# Patient Record
Sex: Male | Born: 1985 | Race: White | Hispanic: No | Marital: Single
Health system: Midwestern US, Community
[De-identification: ages and names within clinical notes are randomized; demographics above are authoritative.]

## PROBLEM LIST (undated history)

## (undated) ENCOUNTER — Emergency Department (HOSPITAL_COMMUNITY): Admission: EM | Payer: Self-pay | Source: Home / Self Care

## (undated) DIAGNOSIS — E079 Disorder of thyroid, unspecified: Secondary | ICD-10-CM

## (undated) HISTORY — PX: TONSILLECTOMY: SUR1361

## (undated) HISTORY — PX: GANGLION CYST EXCISION: SHX1691

## (undated) HISTORY — PX: EYE MUSCLE SURGERY: SHX370

## (undated) HISTORY — PX: TYMPANOSTOMY TUBE PLACEMENT: SHX32

## (undated) HISTORY — DX: Disorder of thyroid, unspecified: E07.9

---
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
## 2006-05-19 ENCOUNTER — Emergency Department (HOSPITAL_COMMUNITY): Admission: EM | Admit: 2006-05-19 | Discharge: 2006-05-19 | Payer: Self-pay | Admitting: Emergency Medicine

## 2006-12-18 ENCOUNTER — Encounter (HOSPITAL_COMMUNITY): Admission: RE | Admit: 2006-12-18 | Discharge: 2006-12-19 | Payer: Self-pay | Admitting: Family Medicine

## 2007-01-10 ENCOUNTER — Ambulatory Visit (HOSPITAL_COMMUNITY): Admission: RE | Admit: 2007-01-10 | Discharge: 2007-01-10 | Payer: Self-pay | Admitting: Endocrinology

## 2007-01-10 DIAGNOSIS — E079 Disorder of thyroid, unspecified: Secondary | ICD-10-CM

## 2007-01-10 HISTORY — DX: Disorder of thyroid, unspecified: E07.9

## 2008-01-13 ENCOUNTER — Emergency Department (HOSPITAL_COMMUNITY): Admission: EM | Admit: 2008-01-13 | Discharge: 2008-01-13 | Payer: Self-pay | Admitting: Emergency Medicine

## 2008-04-06 IMAGING — CR DG CHEST 1V PORT
1 series · 1 of 1 positions shown · non-contrast
Comparison: none

HISTORY: Chest pain, dyspnea

PORTABLE CHEST ONE VIEW:
Portable exam 5445 hours without priors for comparison.
Borderline cardiac enlargement
Pulmonary vascular congestion
Minimal bronchitic changes without infiltrate or effusion.
Multiple cardiac monitoring lines project over chest.
No pneumothorax.

[view not recorded]
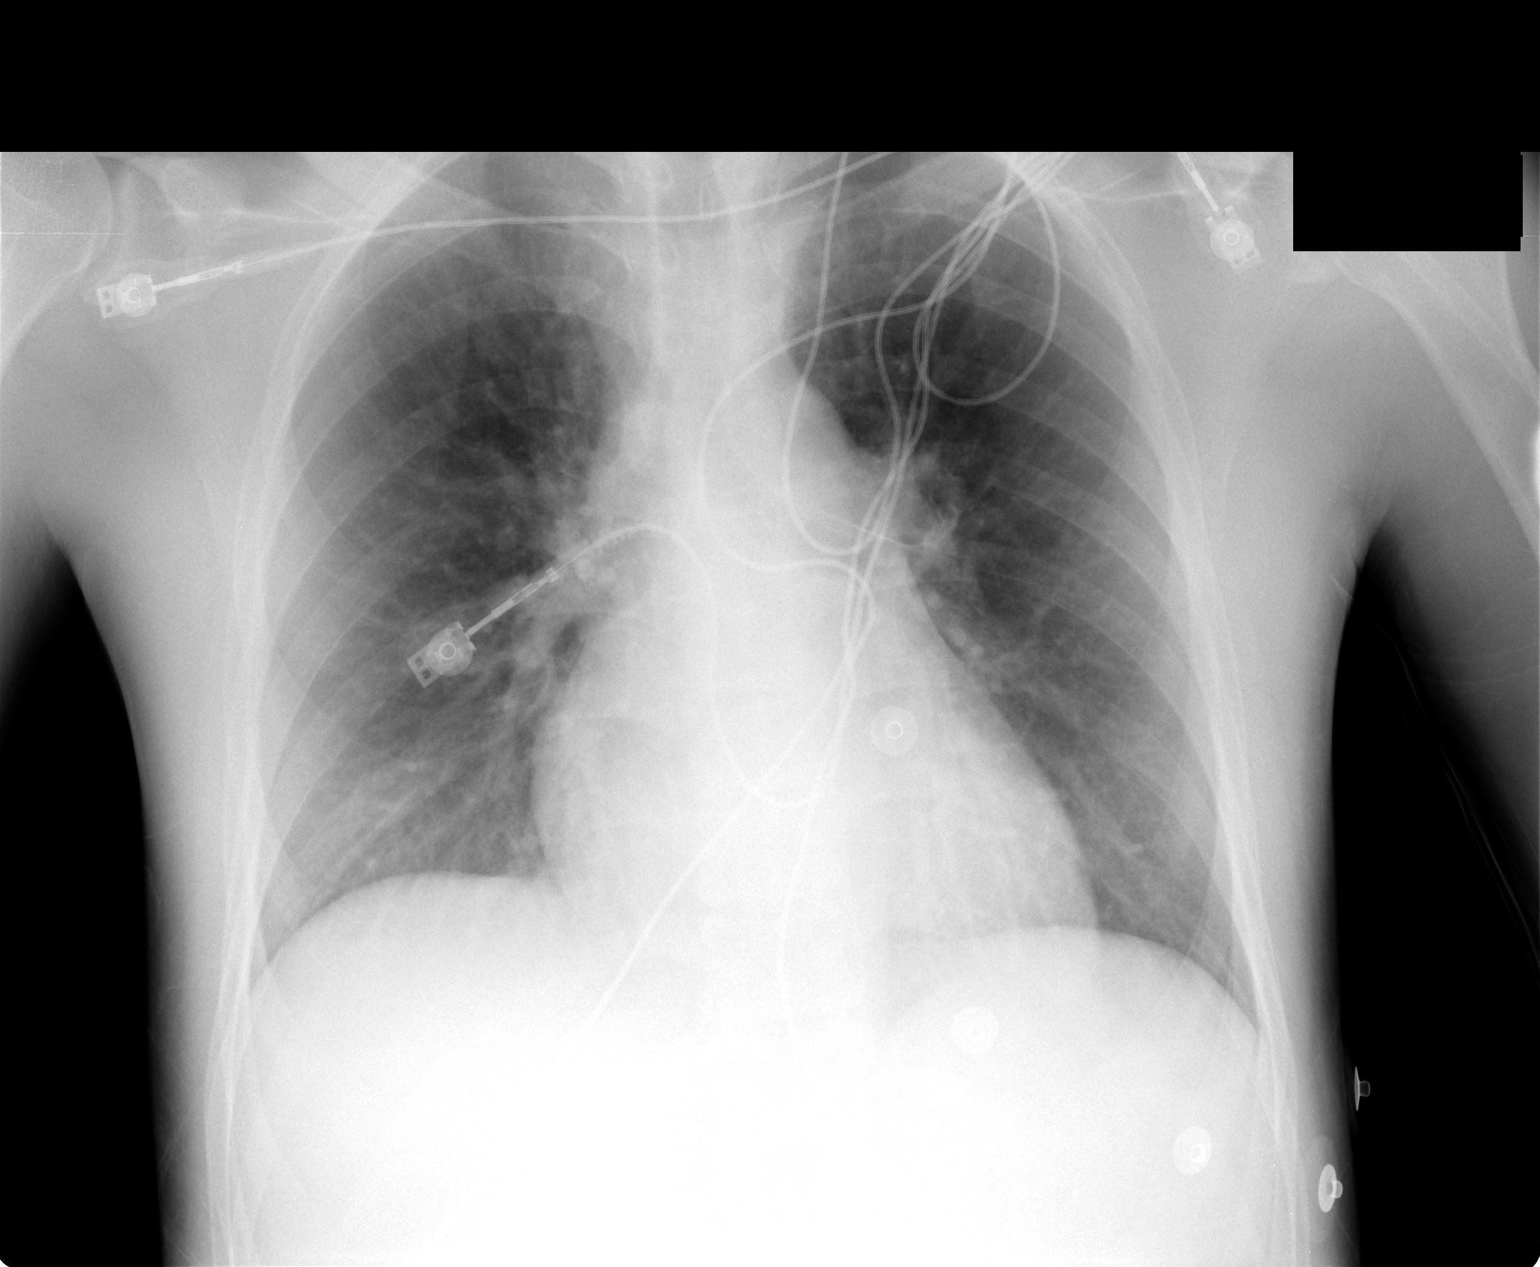

[1 of 1 positions shown; findings below may reference images not displayed]

IMPRESSION: Mild bronchitic changes.

## 2009-12-01 IMAGING — CR DG CHEST 2V
3 series · 3 of 3 positions shown · non-contrast
Comparison: 05/19/2006.

CLINICAL DATA: Neck and back pain.  Short of breath and cough.

CHEST - 2 VIEW

[view not recorded (1 of 3)]
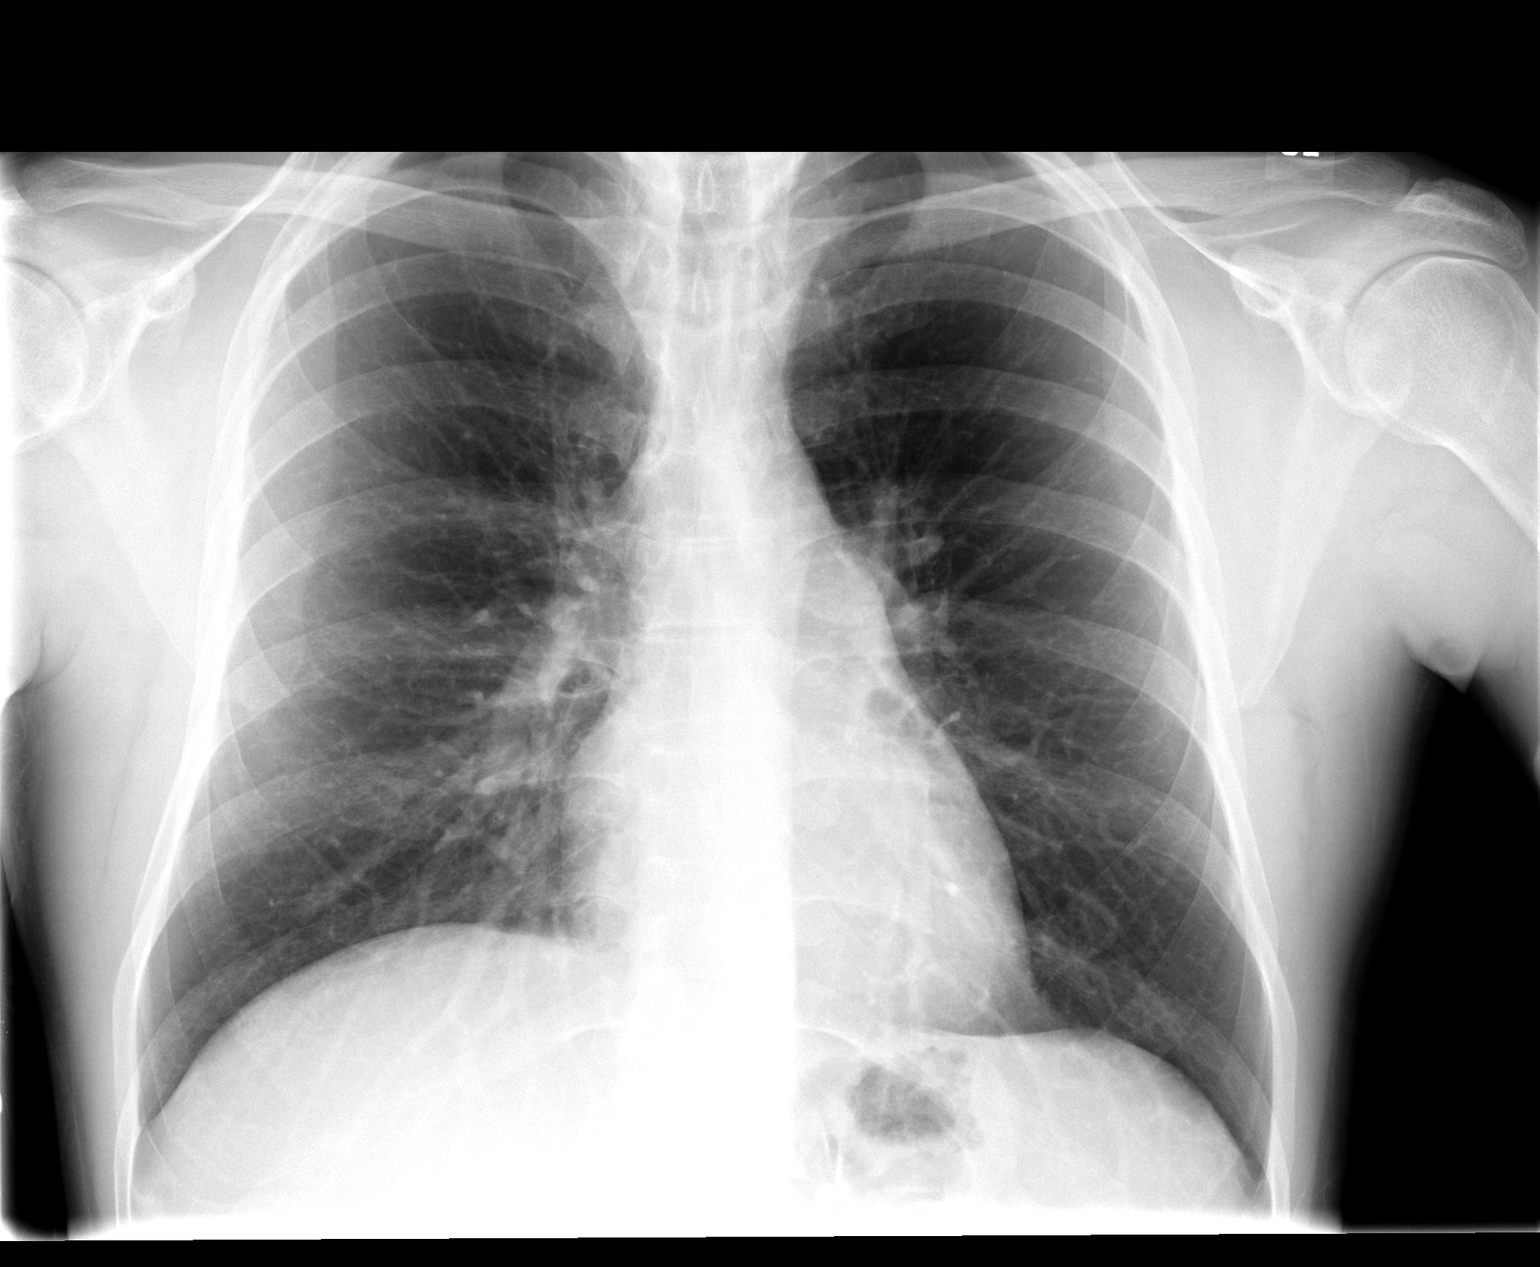

[view not recorded (2 of 3)]
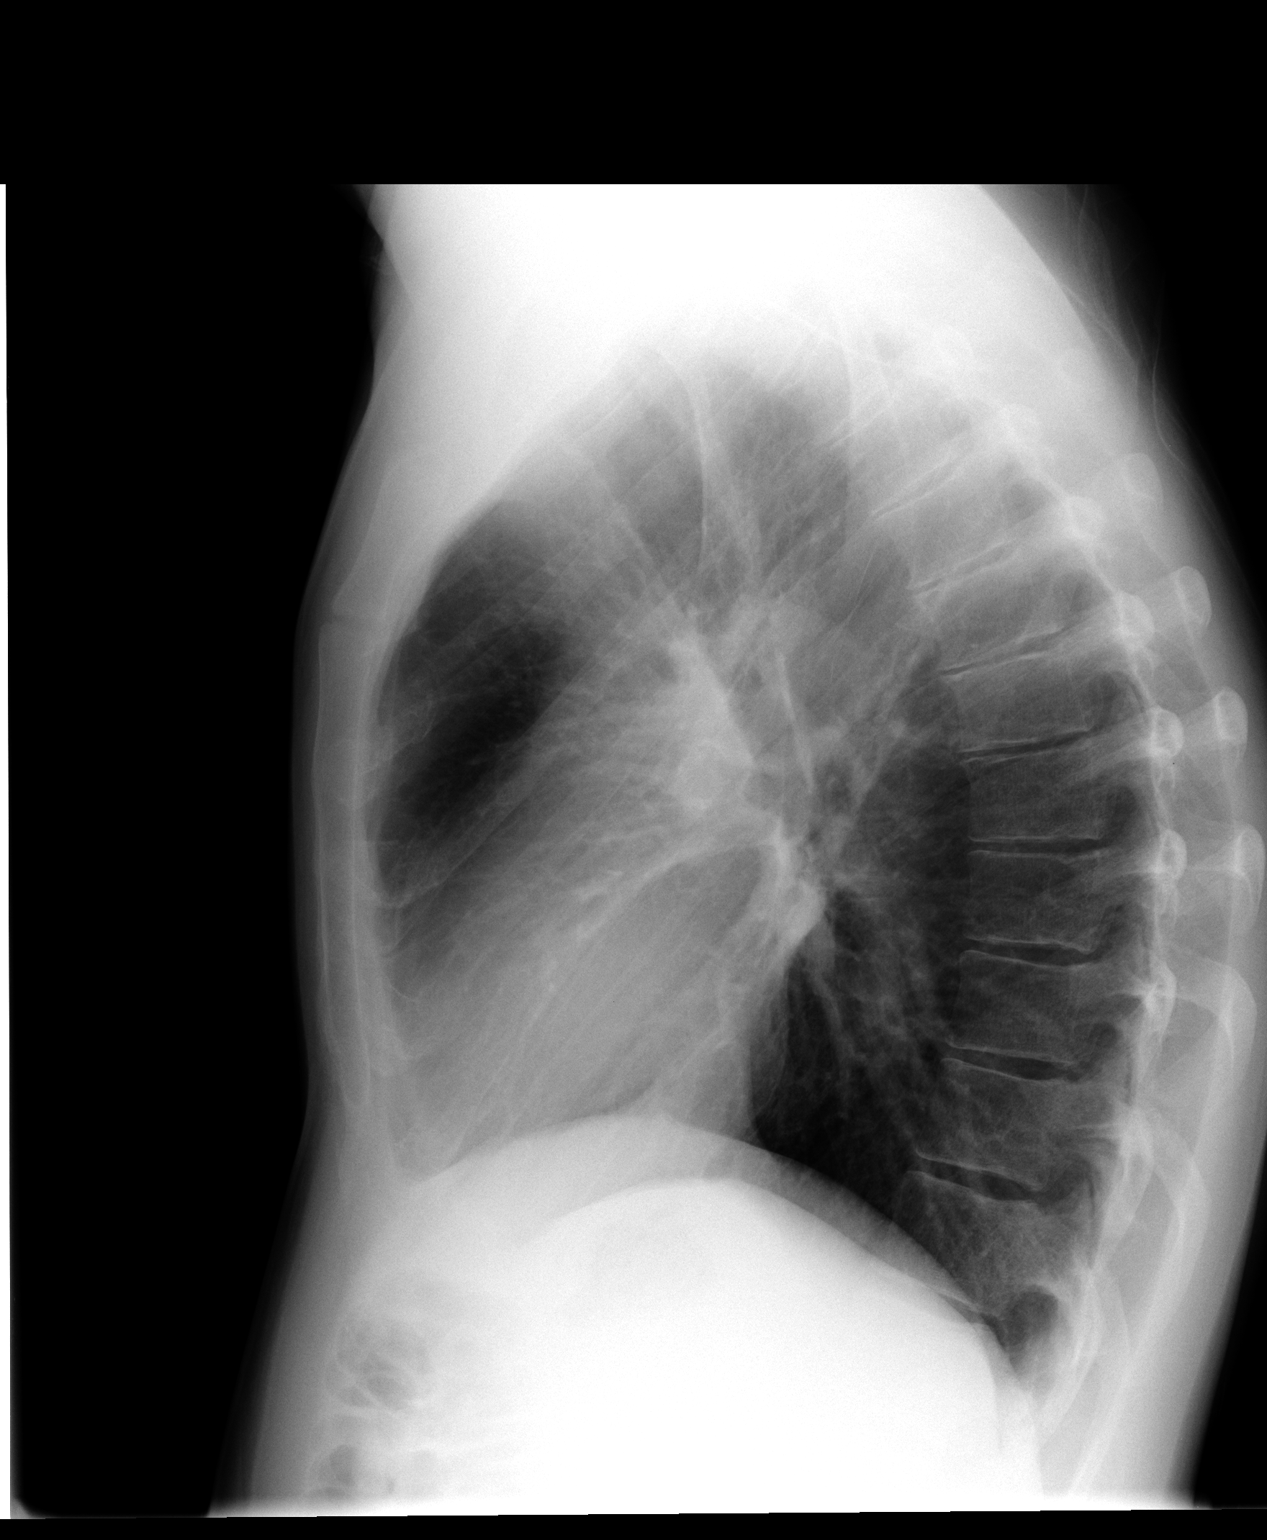

[view not recorded (3 of 3)]
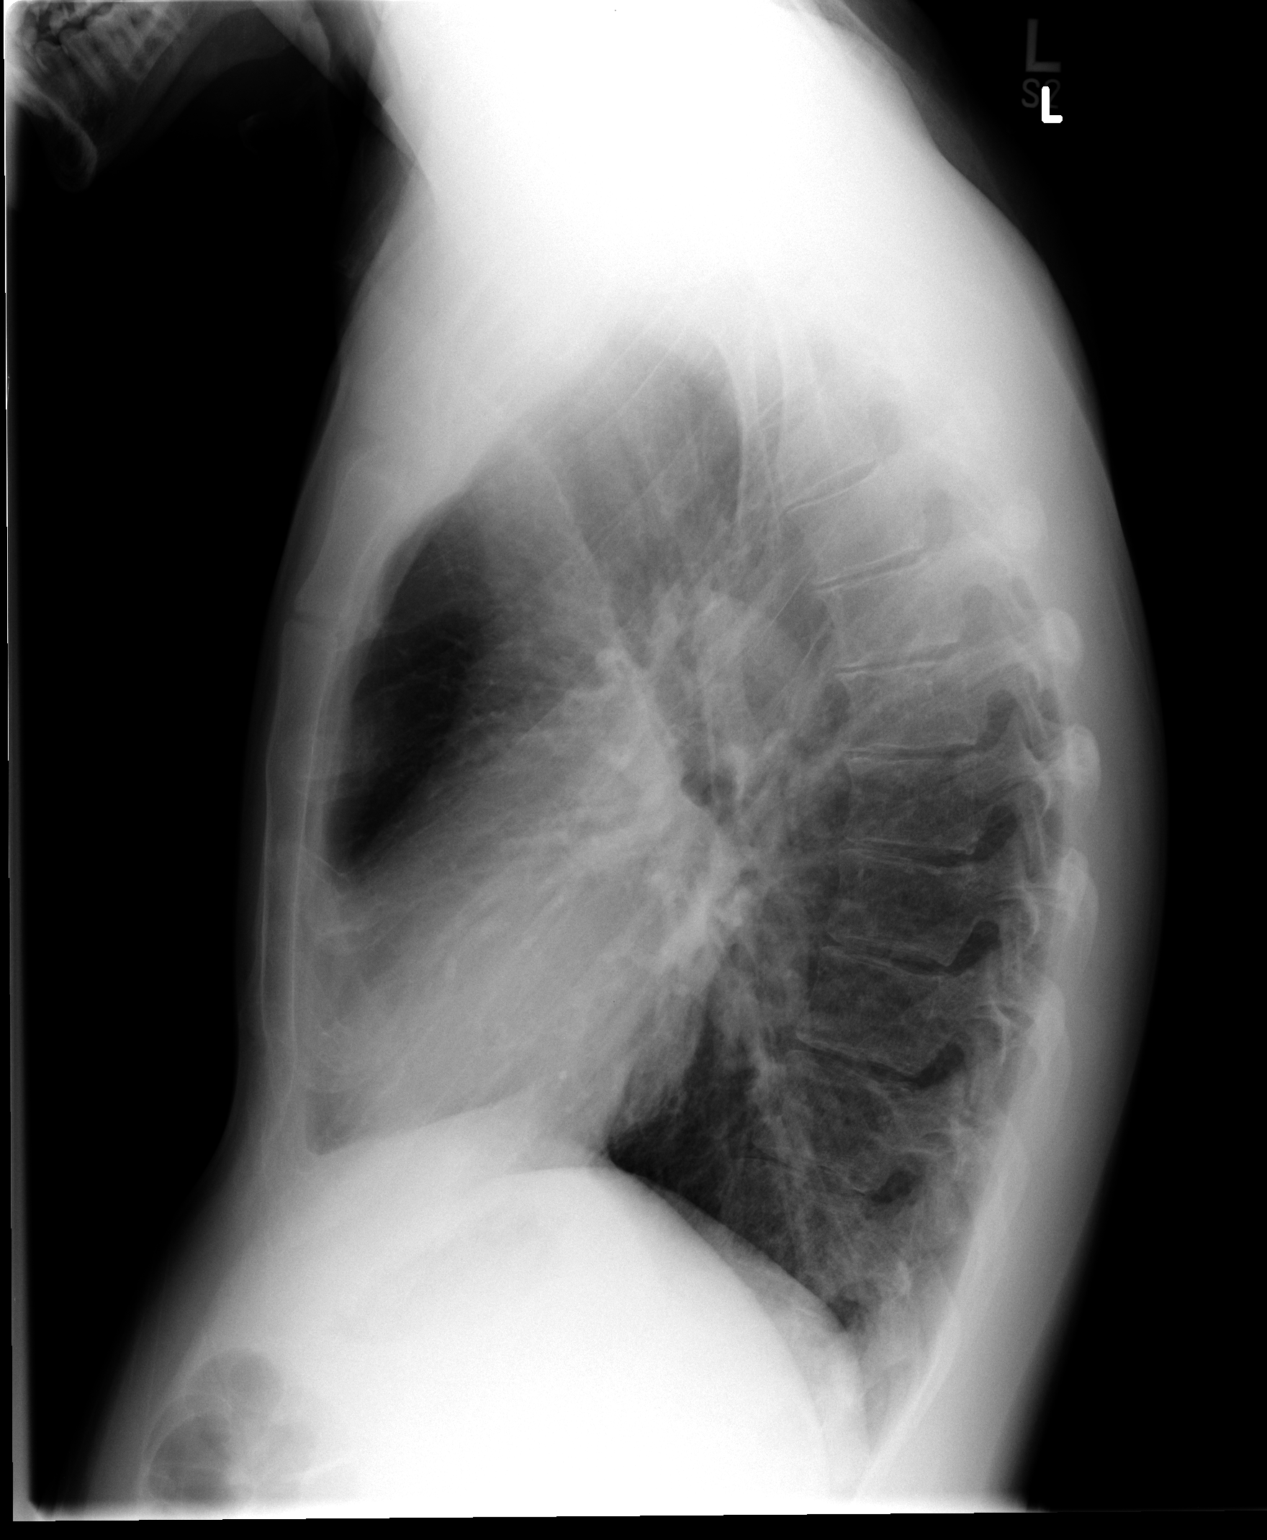

[3 of 3 positions shown; findings below may reference images not displayed]

FINDINGS: Heart size is normal and the vascularity is normal.
There is no infiltrate or effusion.  The lungs are clear and no
mass lesion is present.
IMPRESSION: No active cardiopulmonary disease.

## 2010-02-26 ENCOUNTER — Encounter: Payer: Self-pay | Admitting: Endocrinology

## 2012-05-26 ENCOUNTER — Encounter (HOSPITAL_COMMUNITY): Payer: Self-pay | Admitting: *Deleted

## 2012-05-26 DIAGNOSIS — Y929 Unspecified place or not applicable: Secondary | ICD-10-CM | POA: Insufficient documentation

## 2012-05-26 DIAGNOSIS — Y939 Activity, unspecified: Secondary | ICD-10-CM | POA: Insufficient documentation

## 2012-05-26 DIAGNOSIS — F17200 Nicotine dependence, unspecified, uncomplicated: Secondary | ICD-10-CM | POA: Insufficient documentation

## 2012-05-26 DIAGNOSIS — S4980XA Other specified injuries of shoulder and upper arm, unspecified arm, initial encounter: Secondary | ICD-10-CM | POA: Insufficient documentation

## 2012-05-26 DIAGNOSIS — S46909A Unspecified injury of unspecified muscle, fascia and tendon at shoulder and upper arm level, unspecified arm, initial encounter: Secondary | ICD-10-CM | POA: Insufficient documentation
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
## 2016-12-26 ENCOUNTER — Encounter: Payer: Self-pay | Admitting: Internal Medicine

## 2016-12-26 ENCOUNTER — Ambulatory Visit: Payer: Self-pay | Admitting: Internal Medicine

## 2016-12-26 VITALS — BP 160/100 | HR 72 | Resp 12 | Ht 67.0 in | Wt 170.0 lb

## 2016-12-26 DIAGNOSIS — Z23 Encounter for immunization: Secondary | ICD-10-CM

## 2016-12-26 DIAGNOSIS — Z720 Tobacco use: Secondary | ICD-10-CM

## 2016-12-26 DIAGNOSIS — E890 Postprocedural hypothyroidism: Secondary | ICD-10-CM

## 2016-12-26 DIAGNOSIS — R5383 Other fatigue: Secondary | ICD-10-CM

## 2016-12-26 MED ORDER — METOPROLOL TARTRATE 25 MG PO TABS: 25.0000 mg | ORAL_TABLET | Freq: Two times a day (BID) | ORAL | 11 refills | Status: AC

## 2016-12-26 NOTE — Patient Instructions (Signed)
Tobacco Cessation:   [id number] or [phone], the former for support and possibly free nicotine patches/gum and support; the latter for [facility] Smoking cessation class. Get rid of all smoking supplies:  Cigarettes, lighters, ashtrays--no stashes just in case at home if you are serious.  For Nicotine gum:  When you feel need for smoking:  Place nicotine gum in mouth and chew until juicy, then stick between gum and cheek.  You will absorb the nicotine from your mouth.   Do not aggressively chew gum. Spit gum out in garbage once you feel you are done with it. 
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
## 2016-12-27 LAB — T4, FREE: Free T4: 0.92 ng/dL (ref 0.82–1.77)

## 2016-12-27 LAB — CBC WITH DIFFERENTIAL/PLATELET
BASOS ABS: 0 10*3/uL (ref 0.0–0.2)
BASOS: 0 %
EOS (ABSOLUTE): 0.2 10*3/uL (ref 0.0–0.4)
EOS: 2 %
HEMATOCRIT: 47.4 % (ref 37.5–51.0)
HEMOGLOBIN: 16.1 g/dL (ref 13.0–17.7)
IMMATURE GRANS (ABS): 0.1 10*3/uL (ref 0.0–0.1)
Immature Granulocytes: 1 %
LYMPHS: 19 %
Lymphocytes Absolute: 1.7 10*3/uL (ref 0.7–3.1)
MCH: 31.5 pg (ref 26.6–33.0)
MCHC: 34 g/dL (ref 31.5–35.7)
MCV: 93 fL (ref 79–97)
MONOCYTES: 8 %
Monocytes Absolute: 0.7 10*3/uL (ref 0.1–0.9)
NEUTROS ABS: 6.3 10*3/uL (ref 1.4–7.0)
Neutrophils: 70 %
Platelets: 192 10*3/uL (ref 150–379)
RBC: 5.11 x10E6/uL (ref 4.14–5.80)
RDW: 14.3 % (ref 12.3–15.4)
WBC: 9 10*3/uL (ref 3.4–10.8)

## 2016-12-27 LAB — COMPREHENSIVE METABOLIC PANEL
ALBUMIN: 4.9 g/dL (ref 3.5–5.5)
ALT: 13 IU/L (ref 0–44)
AST: 21 IU/L (ref 0–40)
Albumin/Globulin Ratio: 2 (ref 1.2–2.2)
Alkaline Phosphatase: 74 IU/L (ref 39–117)
BUN / CREAT RATIO: 21 — AB (ref 9–20)
BUN: 22 mg/dL — AB (ref 6–20)
CHLORIDE: 102 mmol/L (ref 96–106)
CO2: 24 mmol/L (ref 20–29)
Calcium: 9.8 mg/dL (ref 8.7–10.2)
Creatinine, Ser: 1.06 mg/dL (ref 0.76–1.27)
GFR calc non Af Amer: 92 mL/min/{1.73_m2} (ref >59)
GFR, EST AFRICAN AMERICAN: 107 mL/min/{1.73_m2} (ref >59)
GLOBULIN, TOTAL: 2.5 g/dL (ref 1.5–4.5)
GLUCOSE: 82 mg/dL (ref 65–99)
Potassium: 5 mmol/L (ref 3.5–5.2)
Sodium: 142 mmol/L (ref 134–144)
TOTAL PROTEIN: 7.4 g/dL (ref 6.0–8.5)

## 2016-12-27 LAB — TSH: TSH: 5.8 u[IU]/mL — ABNORMAL HIGH (ref 0.450–4.500)

## 2017-01-04 ENCOUNTER — Ambulatory Visit (INDEPENDENT_AMBULATORY_CARE_PROVIDER_SITE_OTHER): Payer: Self-pay | Admitting: Internal Medicine

## 2017-01-04 ENCOUNTER — Ambulatory Visit (INDEPENDENT_AMBULATORY_CARE_PROVIDER_SITE_OTHER): Payer: Self-pay | Admitting: Licensed Clinical Social Worker

## 2017-01-04 VITALS — BP 140/82 | HR 68

## 2017-01-04 DIAGNOSIS — F439 Reaction to severe stress, unspecified: Secondary | ICD-10-CM

## 2017-01-04 DIAGNOSIS — I10 Essential (primary) hypertension: Secondary | ICD-10-CM
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
## 2017-01-14 ENCOUNTER — Other Ambulatory Visit: Payer: Self-pay | Admitting: Licensed Clinical Social Worker

## 2017-02-08 ENCOUNTER — Encounter: Payer: Self-pay | Admitting: Internal Medicine

## 2017-02-08 ENCOUNTER — Other Ambulatory Visit: Payer: Self-pay | Admitting: Internal Medicine

## 2017-02-08 DIAGNOSIS — E010 Iodine-deficiency related diffuse (endemic) goiter: Secondary | ICD-10-CM

## 2017-02-08 DIAGNOSIS — E079 Disorder of thyroid, unspecified: Secondary | ICD-10-CM

## 2017-02-12 ENCOUNTER — Other Ambulatory Visit: Payer: Self-pay | Admitting: Licensed Clinical Social Worker

## 2017-02-15 ENCOUNTER — Ambulatory Visit: Payer: Self-pay | Admitting: Licensed Clinical Social Worker

## 2017-02-15 DIAGNOSIS — F439 Reaction to severe stress, unspecified: Secondary | ICD-10-CM

## 2017-02-19 NOTE — Progress Notes (Signed)
   THERAPY PROGRESS NOTE  Session Time: 60min  Participation Level: Active  Behavioral Response: Casual and Well GroomedAlertEuthymic  Type of Therapy: Individual Therapy  Treatment Goals addressed: Coping  Interventions: Motivational Interviewing and Supportive  Summary: Joseph HuaMatthew P Freeman is a 32 y.o. male who presents with a euthymic mood and appropriate affect. He reported that "everything has been the same," as he continues to work and live with his parents. He described the stress he experiences living with his mother, who he stated is "nosey." He reported that he feels trapped. He shared that he has started spending more time with his daughter, which he enjoys. He expressed frustration that the mother of his daughter is very difficult to co-parent with, constantly asking for more from him. He stated that he sometimes wants to move away. He appeared somewhat receptive to LCSW feedback about the importance of working together so that he can maintain a good relationship with his daughter. [name] and LCSW created his treatment plan; [name]'s only current goal is to decrease irritation and frustration. He scaled himself as a 7 out of 10 for irritation/frustration.   Suicidal/Homicidal: Nowithout intent/plan  Therapist Response: LCSW utilized supportive counseling techniques throughout the session in order to validate emotions and encourage open expression of emotion. LCSW and Susy FrizzleMatt discussed goals for counseling sessions and identified the focus of treatment.  Plan: Return again in 2 weeks.    Nilda Simmeratosha Jadien Lehigh, LCSW 02/19/2017

## 2017-03-04 ENCOUNTER — Other Ambulatory Visit: Payer: Self-pay | Admitting: Licensed Clinical Social Worker

## 2017-07-09 ENCOUNTER — Ambulatory Visit: Payer: Self-pay | Admitting: Internal Medicine

## 2020-03-30 ENCOUNTER — Other Ambulatory Visit: Payer: Self-pay

## 2020-03-30 ENCOUNTER — Encounter: Payer: Non-veteran care | Attending: Internal Medicine | Admitting: Internal Medicine

## 2020-03-30 ENCOUNTER — Other Ambulatory Visit
Admission: RE | Admit: 2020-03-30 | Discharge: 2020-03-30 | Disposition: A | Discharge status: Home / Self Care | Payer: Non-veteran care | Source: Ambulatory Visit | Attending: Internal Medicine | Admitting: Internal Medicine

## 2020-03-30 DIAGNOSIS — Z880 Allergy status to penicillin: Secondary | ICD-10-CM | POA: Insufficient documentation

## 2020-03-30 DIAGNOSIS — L02214 Cutaneous abscess of groin: Secondary | ICD-10-CM | POA: Insufficient documentation

## 2020-03-30 DIAGNOSIS — F17200 Nicotine dependence, unspecified, uncomplicated: Secondary | ICD-10-CM | POA: Insufficient documentation

## 2020-03-30 DIAGNOSIS — B999 Unspecified infectious disease: Secondary | ICD-10-CM | POA: Insufficient documentation

## 2020-03-30 DIAGNOSIS — I10 Essential (primary) hypertension: Secondary | ICD-10-CM | POA: Insufficient documentation

## 2020-03-31 LAB — AEROBIC CULTURE W GRAM STAIN (SUPERFICIAL SPECIMEN): Culture: NO GROWTH

## 2020-03-31 NOTE — Progress Notes (Signed)
Joseph Freeman ([id number]) Visit Report for 03/30/2020 Allergy List Details Patient Name: Joseph Freeman, Joseph Freeman. Date of Service: 03/30/2020 9:30 AM Medical Record Number: [id number] Patient Account Number: [id number] Date of Birth/Sex: 1985/12/05 (35 y.o. M) Treating Freeman: Joseph Freeman Primary Care Provider: Julieanne Freeman Other Clinician: Referring Provider: Jorje Freeman Treating Provider/Extender: Joseph Freeman Weeks in Treatment: 0 Allergies Active Allergies penicillin Allergy Notes Electronic Signature(s) Signed: 03/30/2020 12:34:39 PM By: Joseph Freeman, Joseph Freeman Entered By: Joseph Freeman, Joseph Freeman on 03/30/2020 09:55:08 Joseph Joseph Freeman ([id number]) -------------------------------------------------------------------------------- Arrival Information Details Patient Name: Joseph Joseph Freeman. Date of Service: 03/30/2020 9:30 AM Medical Record Number: [id number] Patient Account Number: [id number] Date of Birth/Sex: 1985-04-16 (35 y.o. M) Treating Freeman: Joseph Freeman Primary Care Provider: Julieanne Freeman Other Clinician: Referring Provider: Jorje Freeman Treating Provider/Extender: Joseph Freeman in Treatment: 0 Visit Information Patient Arrived: Ambulatory Arrival Time: 09:51 Accompanied By: self Transfer Assistance: None Patient Identification Verified: Yes Secondary Verification Process Completed: Yes Electronic Signature(s) Signed: 03/30/2020 12:34:39 PM By: Joseph Freeman, Joseph Freeman Entered By: Joseph Freeman, Joseph Prader on 03/30/2020 09:52:50 Joseph Joseph Freeman ([id number]) -------------------------------------------------------------------------------- Clinic Level of Care Assessment Details Patient Name: Joseph Joseph Freeman. Date of Service: 03/30/2020 9:30 AM Medical Record Number: [id number] Patient Account Number: [id number] Date of Birth/Sex: 1985-07-04 (35 y.o. M) Treating Freeman: Joseph Freeman Primary Care Provider: Julieanne Freeman Other  Clinician: Referring Provider: Jorje Freeman Treating Provider/Extender: Joseph Freeman in Treatment: 0 Clinic Level of Care Assessment Items TOOL 1 Quantity Score []  - Use when EandM and Procedure is performed on INITIAL visit 0 ASSESSMENTS - Nursing Assessment / Reassessment X - General Physical Exam (combine w/ comprehensive assessment (listed just below) when performed on new 1 20 pt. evals) X- 1 25 Comprehensive Assessment (HX, ROS, Risk Assessments, Wounds Hx, etc.) ASSESSMENTS - Wound and Skin Assessment / Reassessment []  - Dermatologic / Skin Assessment (not related to wound area) 0 ASSESSMENTS - Ostomy and/or Continence Assessment and Care []  - Incontinence Assessment and Management 0 []  - 0 Ostomy Care Assessment and Management (repouching, etc.) PROCESS - Coordination of Care X - Simple Patient / Family Education for ongoing care 1 15 []  - 0 Complex (extensive) Patient / Family Education for ongoing care X- 1 10 Staff obtains , Records, Test Results / Process Orders []  - 0 Staff telephones HHA, Nursing Homes / Clarify orders / etc []  - 0 Routine Transfer to another Facility (non-emergent condition) []  - 0 Routine Hospital Admission (non-emergent condition) X- 1 15 New Admissions / / Ordering NPWT, Apligraf, etc. []  - 0 Emergency Hospital Admission (emergent condition) PROCESS - Special Needs []  - Pediatric / Minor Patient Management 0 []  - 0 Isolation Patient Management []  - 0 Hearing / Language / Visual special needs []  - 0 Assessment of Community assistance (transportation, D/C planning, etc.) []  - 0 Additional assistance / Altered mentation []  - 0 Support Surface(s) Assessment (bed, cushion, seat, etc.) INTERVENTIONS - Miscellaneous []  - External ear exam 0 []  - 0 Patient Transfer (multiple staff / / Similar devices) []  - 0 Simple Staple / Suture removal (25 or less) []  - 0 Complex Staple / Suture  removal (26 or more) []  - 0 Hypo/Hyperglycemic Management (do not check if billed separately) []  - 0 Ankle / Brachial Index (ABI) - do not check if billed separately Has the patient been seen at the hospital within the last three years: Yes Total Score: 85 Level Of Care: New/Established - Level 3 Joseph Joseph Freeman,  Joseph Joseph Freeman ([id number]) Electronic Signature(s) Signed: 03/30/2020 5:08:01 PM By: Joseph Freeman, BSN, Freeman, CWS, Kim Freeman, BSN Entered By: Joseph Freeman, BSN, Freeman, CWS, Joseph Freeman on 03/30/2020 17:05:22 Joseph Joseph Freeman ([id number]) -------------------------------------------------------------------------------- Encounter Discharge Information Details Patient Name: Joseph Joseph Freeman, Joseph P. Date of Service: 03/30/2020 9:30 AM Medical Record Number: [id number] Patient Account Number: [id number] Date of Birth/Sex: Feb 01, 1986 (35 y.o. M) Treating Freeman: Joseph Freeman Primary Care Provider: Julieanne Freeman Other Clinician: Referring Provider: Jorje Freeman Treating Provider/Extender: Joseph Freeman in Treatment: 0 Encounter Discharge Information Items Post Procedure Vitals Discharge Condition: Stable Temperature (F): 98.2 Ambulatory Status: Ambulatory Pulse (bpm): 95 Discharge Destination: Home Respiratory Rate (breaths/min): 20 Transportation: Private Auto Blood Pressure (mmHg): 174/113 Accompanied By: self Schedule Follow-up Appointment: Yes Clinical Summary of Care: Patient Declined Electronic Signature(s) Signed: 03/31/2020 11:27:08 AM By: Joseph Pax Freeman Entered By: Joseph Freeman on 03/30/2020 12:39:20 Joseph Joseph Freeman ([id number]) -------------------------------------------------------------------------------- Lower Extremity Assessment Details Patient Name: Joseph Joseph Freeman, Joseph P. Date of Service: 03/30/2020 9:30 AM Medical Record Number: [id number] Patient Account Number: [id number] Date of Birth/Sex: February 22, 1985 (35 y.o. M) Treating Freeman: Joseph Freeman Primary Care Provider: Julieanne Freeman Other  Clinician: Referring Provider: Jorje Freeman Treating Provider/Extender: Joseph Freeman in Treatment: 0 Electronic Signature(s) Signed: 03/30/2020 12:34:39 PM By: Joseph Freeman, Joseph Freeman Entered By: Joseph Freeman, Joseph Prader on 03/30/2020 10:00:52 Joseph Joseph Freeman ([id number]) -------------------------------------------------------------------------------- Multi Wound Chart Details Patient Name: Joseph Joseph Freeman, Marselino P. Date of Service: 03/30/2020 9:30 AM Medical Record Number: [id number] Patient Account Number: [id number] Date of Birth/Sex: [birth year] (35 y.o. M) Treating Freeman: Joseph Freeman Primary Care Provider: Julieanne Freeman Other Clinician: Referring Provider: Jorje Freeman Treating Provider/Extender: Joseph Chester in Treatment: 0 Vital Signs Height(in): 68 Pulse(bpm): 95 Weight(lbs): 173 Blood Pressure(mmHg): 174/113 Body Mass Index(BMI): 26 Temperature(F): 98.2 Respiratory Rate(breaths/min): 20 Photos: [N/A:N/A] Wound Location: Right Groin N/A N/A Wounding Event: Gradually Appeared N/A N/A Primary Etiology: Abscess N/A N/A Comorbid History: Asthma, Osteoarthritis N/A N/A Date Acquired: 01/26/2020 N/A N/A Weeks of Treatment: 0 N/A N/A Wound Status: Open N/A N/A Measurements L x W x D (cm) 1.5x2.5x1.2 N/A N/A Area (cm) : 2.945 N/A N/A Volume (cm) : 3.534 N/A N/A % Reduction in Area: 0.00% N/A N/A % Reduction in Volume: -1098.00% N/A N/A Classification: Partial Thickness N/A N/A Exudate Amount: Small N/A N/A Exudate Type: Serous N/A N/A Exudate Color: amber N/A N/A Granulation Amount: None Present (0%) N/A N/A Necrotic Amount: None Present (0%) N/A N/A Exposed Structures: Fascia: No N/A N/A Fat Layer (Subcutaneous Tissue): No Tendon: No Muscle: No Joint: No Bone: No Epithelialization: None N/A N/A Procedures Performed: Incision and Drainage N/A N/A Treatment Notes Electronic Signature(s) Signed: 03/31/2020 12:48:36 PM By: Baltazar Najjar  MD Entered By: Baltazar Najjar on 03/30/2020 11:15:54 Joseph Joseph Freeman ([id number]) -------------------------------------------------------------------------------- Multi-Disciplinary Care Plan Details Patient Name: Joseph Joseph Freeman. Date of Service: 03/30/2020 9:30 AM Medical Record Number: [id number] Patient Account Number: [id number] Date of Birth/Sex: 06-07-1985 (35 y.o. M) Treating Freeman: Joseph Freeman Primary Care Provider: Julieanne Freeman Other Clinician: Referring Provider: Jorje Freeman Treating Provider/Extender: Joseph Kingsbury in Treatment: 0 Active Inactive Medication Nursing Diagnoses: Knowledge deficit related to medication safety: actual or potential Goals: Patient/caregiver will demonstrate understanding of all current medications Date Initiated: 03/30/2020 Target Resolution Date: 03/30/2020 Goal Status: Active Patient/caregiver will demonstrate understanding of new oral/IV medications prescribed at the [facility] (topical prescriptions are covered under the skin breakdown problem) Date Initiated: 03/30/2020 Target Resolution Date: 03/30/2020 Goal Status: Active Interventions: Assess for medication contraindications each visit where new  medications are prescribed Assess patient/caregiver ability to manage medication regimen upon admission and as needed Patient/Caregiver given reconciled medication list upon admission, changes in medications and discharge from the Wound Center Provide education on medication safety Treatment Activities: New medication prescribed at Wound Center : 03/30/2020 Notes: Orientation to the Wound Care Program Nursing Diagnoses: Knowledge deficit related to the wound healing center program Goals: Patient/caregiver will verbalize understanding of the Wound Healing Center Program Date Initiated: 03/30/2020 Target Resolution Date: 03/30/2020 Goal Status: Active Interventions: Provide education on orientation to the wound center Notes: Soft  Tissue Infection Nursing Diagnoses: Impaired tissue integrity Potential for infection: soft tissue Goals: Patient's soft tissue infection will resolve Date Initiated: 03/30/2020 Target Resolution Date: 03/30/2020 Goal Status: Active Interventions: Assess signs and symptoms of infection every visit Joseph HuaFREEMAN, Joseph P. ([id number]) Treatment Activities: Culture : 03/30/2020 Culture and sensitivity : 03/30/2020 Systemic antibiotics : 03/30/2020 Notes: Wound/Skin Impairment Nursing Diagnoses: Impaired tissue integrity Goals: Patient/caregiver will verbalize understanding of skin care regimen Date Initiated: 03/30/2020 Target Resolution Date: 03/30/2020 Goal Status: Active Ulcer/skin breakdown will have a volume reduction of 30% by week 4 Date Initiated: 03/30/2020 Target Resolution Date: 04/27/2020 Goal Status: Active Interventions: Assess ulceration(s) every visit Treatment Activities: Referred to DME provider for dressing supplies : 03/30/2020 Skin care regimen initiated : 03/30/2020 Notes: Electronic Signature(s) Signed: 03/30/2020 5:08:01 PM By: Joseph GurneyWoody, BSN, Freeman, CWS, Kim Freeman, BSN Entered By: Joseph GurneyWoody, BSN, Freeman, CWS, Joseph Freeman on 03/30/2020 10:44:13 Joseph HuaFREEMAN, Joseph P. ([id number]) -------------------------------------------------------------------------------- Pain Assessment Details Patient Name: Joseph BurlyFREEMAN, Joseph P. Date of Service: 03/30/2020 9:30 AM Medical Record Number: [id number] Patient Account Number: [id number] Date of Birth/Sex: [birth year] (35 y.o. M) Treating Freeman: Joseph BlockerSanchez, Joseph Freeman Primary Care Provider: Julieanne MansonMULBERRY, ELIZABETH Other Clinician: Referring Provider: Jorje GuildWATT, ALAN Treating Provider/Extender: Joseph CarolinaOBSON, MICHAEL G Weeks in Treatment: 0 Active Problems Location of Pain Severity and Description of Pain Patient Has Paino Yes Site Locations Pain Location: Pain in Ulcers Rate the pain. Current Pain Level: 6 Pain Management and Medication Current Pain Management: Electronic  Signature(s) Signed: 03/30/2020 12:34:39 PM By: Joseph HaggisSanchez Pereyda, Joseph PraderKenia Freeman Entered By: Joseph HaggisSanchez Pereyda, Joseph Freeman on 03/30/2020 09:53:06 Joseph HuaFREEMAN, Joseph P. ([id number]) -------------------------------------------------------------------------------- Patient/Caregiver Education Details Patient Name: Joseph HuaFREEMAN, Joseph P. Date of Service: 03/30/2020 9:30 AM Medical Record Number: [id number] Patient Account Number: [id number] Date of Birth/Gender: [birth year] (35 y.o. M) Treating Freeman: Joseph CoventryWoody, Joseph Freeman Primary Care Physician: Joseph MansonMULBERRY, ELIZABETH Other Clinician: Referring Physician: Jorje GuildWATT, ALAN Treating Physician/Extender: Joseph CarolinaOBSON, MICHAEL G Weeks in Treatment: 0 Education Assessment Education Provided To: Patient Education Topics Provided Welcome To The Wound Care Center: Handouts: Welcome To The Wound Care Center Methods: Explain/Verbal Responses: State content correctly Wound/Skin Impairment: Handouts: Caring for Your Ulcer, Other: wound care as prescribed Methods: Demonstration, Explain/Verbal Responses: State content correctly Electronic Signature(s) Signed: 03/30/2020 5:08:01 PM By: Joseph GurneyWoody, BSN, Freeman, CWS, Kim Freeman, BSN Entered By: Joseph GurneyWoody, BSN, Freeman, CWS, Joseph Freeman on 03/30/2020 15:36:33 Joseph HuaFREEMAN, Joseph P. ([id number]) -------------------------------------------------------------------------------- Wound Assessment Details Patient Name: Joseph BurlyFREEMAN, Joseph P. Date of Service: 03/30/2020 9:30 AM Medical Record Number: [id number] Patient Account Number: [id number] Date of Birth/Sex: [birth year] (35 y.o. M) Treating Freeman: Joseph CoventryWoody, Joseph Freeman Primary Care Provider: Julieanne MansonMULBERRY, ELIZABETH Other Clinician: Referring Provider: Jorje GuildWATT, ALAN Treating Provider/Extender: Joseph CarolinaOBSON, MICHAEL G Weeks in Treatment: 0 Wound Status Wound Number: 1 Primary Etiology: Abscess Wound Location: Right Groin Wound Status: Open Wounding Event: Gradually Appeared Comorbid History: Asthma, Osteoarthritis Date Acquired: 01/26/2020 Weeks Of Treatment:  0 Clustered Wound: No Photos Wound Measurements Length: (cm) 1.5 Width: (cm) 2.5 Depth: (cm) 1.2 Area: (cm) 2.945 Volume: (cm)  3.534 % Reduction in Area: 0% % Reduction in Volume: -1098% Epithelialization: None Tunneling: No Undermining: No Wound Description Classification: Partial Thickness Exudate Amount: Small Exudate Type: Serous Exudate Color: amber Foul Odor After Cleansing: No Slough/Fibrino No Wound Bed Granulation Amount: None Present (0%) Exposed Structure Necrotic Amount: None Present (0%) Fascia Exposed: No Fat Layer (Subcutaneous Tissue) Exposed: No Tendon Exposed: No Muscle Exposed: No Joint Exposed: No Bone Exposed: No Electronic Signature(s) Signed: 03/30/2020 5:08:01 PM By: Joseph Freeman, BSN, Freeman, CWS, Kim Freeman, BSN Entered By: Joseph Freeman, BSN, Freeman, CWS, Joseph Freeman on 03/30/2020 10:40:55 Joseph Joseph Freeman ([id number]) -------------------------------------------------------------------------------- Vitals Details Patient Name: Joseph Joseph Freeman, Joseph P. Date of Service: 03/30/2020 9:30 AM Medical Record Number: [id number] Patient Account Number: [id number] Date of Birth/Sex: 1985/12/05 (35 y.o. M) Treating Freeman: Joseph Freeman Primary Care Provider: Julieanne Freeman Other Clinician: Referring Provider: Jorje Freeman Treating Provider/Extender: Joseph U[place]and in Treatment: 0 Vital Signs Time Taken: 09:53 Temperature (F): 98.2 Height (in): 68 Pulse (bpm): 95 Source: Measured Respiratory Rate (breaths/min): 20 Weight (lbs): 173 Blood Pressure (mmHg): 174/113 Body Mass Index (BMI): 26.3 Reference Range: 80 - 120 mg / dl Notes currently detoxing-9 days Electronic Signature(s) Signed: 03/30/2020 12:34:39 PM By: Joseph Freeman, Joseph Freeman Entered By: Joseph Freeman, Joseph Prader on 03/30/2020 09:54:09

## 2020-03-31 NOTE — Progress Notes (Signed)
GIANLUCAS, EVENSON ([id number]) Visit Report for 03/30/2020 Chief Complaint Document Details Patient Name: Joseph Joseph Freeman, Joseph Joseph Freeman. Date of Service: 03/30/2020 9:30 AM Medical Record Number: [id number] Patient Account Number: [id number] Date of Birth/Sex: 1986/01/12 (35 y.o. M) Treating RN: Huel Coventry Primary Care Provider: Julieanne Manson Other Clinician: Referring Provider: Jorje Guild Treating Provider/Extender: Altamese [place] in Treatment: 0 Information Obtained from: Patient Chief Complaint 03/30/2020; patient is here for review of an area in his right upper scrotum Electronic Signature(s) Signed: 03/31/2020 12:48:36 PM By: Baltazar Najjar MD Entered By: Baltazar Najjar on 03/30/2020 11:16:40 Secord, Haden P. ([id number]) -------------------------------------------------------------------------------- HPI Details Patient Name: Joseph Joseph Freeman, Joseph P. Date of Service: 03/30/2020 9:30 AM Medical Record Number: [id number] Patient Account Number: [id number] Date of Birth/Sex: 1985/08/13 (35 y.o. M) Treating RN: Huel Coventry Primary Care Provider: Julieanne Manson Other Clinician: Referring Provider: Jorje Guild Treating Provider/Extender: Altamese [place] in Treatment: 0 History of Present Illness HPI Description: Admission 03/30/2020 This is a 35 year old man who is currently undergoing detox at [facility]. He is here for an area in his right upper scrotum at the medial part of his inguinal fold. He tells me that sometime around Christmas he developed a very painful swelling in this area. He went to the ER at [facility] where he lives and apparently they did an IandD gave him antibiotics and sent him home. This resolved only several weeks later it recurred. He apparently took himself to urology at [facility] although I do not see any information on this in care everywhere. Once again he had developed a painful swelling in this area that they again IandD and put  him on doxycycline. He has completed this. He states since he was admitted to [facility] he developed some pain in this area and also some drainage at night. He is here for review of this. Past medical history hypothyroidism hypertension, substance abuse that [facility]. He has not been systemically unwell no fever no chills no scrotal pain Electronic Signature(s) Signed: 03/31/2020 12:48:36 PM By: Baltazar Najjar MD Entered By: Baltazar Najjar on 03/30/2020 11:19:35 Joseph Freeman, Joseph P. ([id number]) -------------------------------------------------------------------------------- Incision and Drainage Details Patient Name: Joseph Joseph Freeman, Joseph P. Date of Service: 03/30/2020 9:30 AM Medical Record Number: [id number] Patient Account Number: [id number] Date of Birth/Sex: 12-20-1985 (35 y.o. M) Treating RN: Huel Coventry Primary Care Provider: Julieanne Manson Other Clinician: Referring Provider: Jorje Guild Treating Provider/Extender: Altamese [place] in Treatment: 0 Incision And Drainage Performed Wound #1 Right Groin for: Performed By: Physician Maxwell Caul, MD Incision And Drainage Type: Abscess Location: right groin Level of Consciousness (Pre- Awake and Alert procedure): Pre-procedure Verification/Time Yes - 10:40 Out Taken: Pain Control: Lidocaine Injectable Drainage Of: Purulent Instrument: Blade Bleeding: Moderate Hemostasis Achieved: Pressure Culture Sent: Swab Procedural Pain: 10 Post Procedural Pain: 3 Response to Treatment: Procedure was tolerated well Level of Consciousness (Post- Awake and Alert procedure): Post Procedure Diagnosis Same as Pre-procedure Electronic Signature(s) Signed: 03/30/2020 5:08:01 PM By: Elliot Gurney, BSN, RN, CWS, Kim RN, BSN Signed: 03/31/2020 12:48:36 PM By: Baltazar Najjar MD Entered By: Elliot Gurney, BSN, RN, CWS, Kim on 03/30/2020 10:45:54 Joseph Joseph Freeman  ([id number]) -------------------------------------------------------------------------------- Physical Exam Details Patient Name: Joseph Joseph Freeman, Joseph P. Date of Service: 03/30/2020 9:30 AM Medical Record Number: [id number] Patient Account Number: [id number] Date of Birth/Sex: 09-03-1985 (35 y.o. M) Treating RN: Huel Coventry Primary Care Provider: Julieanne Manson Other Clinician: Referring Provider: Jorje Guild Treating Provider/Extender: Altamese [place] in Treatment: 0 Constitutional Patient is hypertensive.. Pulse regular and  within target range for patient.Marland Kitchen Respirations regular, non-labored and within target range.. Temperature is normal and within the target range for the patient.Marland Kitchen appears in no distress. Gastrointestinal (GI) The patient was very anxious but concerning Nedra Hai he had some tenderness at the inguinal fold or just above it in the lower abdomen no guarding guarding or rebound. Genitourinary (GU) There was no tenderness to palpation in the scrotum testes was normal. Inguinal canal seem normal. Lymphatic No inguinal adenopathy. Notes Wound exam; he had a small raised swelling in the medial part of the inguinal fold the upper scrotum just over where the inguinal canal would enter the scrotum. This was exceptionally tender. I used injectable lidocaine to numb this area and then opened it with a #15 scalpel there was a relatively large amount of purulent drainage which I cultured. Hemostasis with direct pressure Electronic Signature(s) Signed: 03/31/2020 12:48:36 PM By: Baltazar Najjar MD Entered By: Baltazar Najjar on 03/30/2020 11:22:50 Joseph Joseph Freeman ([id number]) -------------------------------------------------------------------------------- Physician Orders Details Patient Name: Joseph Joseph Freeman. Date of Service: 03/30/2020 9:30 AM Medical Record Number: [id number] Patient Account Number: [id number] Date of Birth/Sex: [birth year] (35 y.o. M) Treating RN: Huel Coventry Primary Care Provider: Julieanne Manson Other Clinician: Referring Provider: Jorje Guild Treating Provider/Extender: Altamese [place] in Treatment: 0 Verbal / Phone Orders: No Diagnosis Coding Follow-up Appointments o Return Appointment in 1 week. Bathing/ Shower/ Hygiene o May shower; gently cleanse wound with antibacterial soap, rinse and pat dry prior to dressing wounds Medications-Please add to medication list. Wound #1 Right Groin o FreemanO. Antibiotics - Septra DS o Topical Antibiotic - Neosporin Wound Treatment Wound #1 - Groin Wound Laterality: Right Cleanser: Soap and Water Discharge Instructions: Gently cleanse wound with antibacterial soap, rinse and pat dry prior to dressing wounds Primary Dressing: Gauze Discharge Instructions: As directed: dry, moistened with saline or moistened with Dakins Solution Secured With: Tegaderm Film 4x4 (in/in) Discharge Instructions: Apply to wound bed Laboratory o Bacteria identified in Wound by Culture (MICRO) - right groin oooo LOINC Code: 6462-6 oooo Convenience Name: Wound culture routine Patient Medications Allergies: penicillin Notifications Medication Indication Start End sulfamethoxazole-trimethoprim 03/30/2020 DOSE 1 - oral 800 mg-160 mg tablet - 1 tablet oral twice daily Electronic Signature(s) Signed: 03/30/2020 5:08:01 PM By: Elliot Gurney, BSN, RN, CWS, Kim RN, BSN Signed: 03/31/2020 12:48:36 PM By: Baltazar Najjar MD Entered By: Elliot Gurney, BSN, RN, CWS, Kim on 03/30/2020 10:55:19 RANON, COVEN ([id number]) -------------------------------------------------------------------------------- Prescription 03/30/2020 Patient Name: Joseph Joseph Freeman, Jerran P. Provider: Maxwell Caul MD Date of Birth: [birth year] NPI#: [id number] Sex: Judie Petit DEA#: [id number] Phone #: [phone] License #: [id number] Patient Address: [facility] [address] [facility] [place], [place] [address] [address], [address] [place], [place] [address] [phone] Allergies penicillin Medication Medication: Route: Strength: Form: sulfamethoxazole 800 mg-trimethoprim oral 800 mg-160 mg tablet 160 mg tablet Class: ABSORBABLE SULFONAMIDE ANTIBACTERIAL AGENTS Dose: Frequency / Time: Indication: 1 1 tablet oral twice daily Number of Refills: Number of Units: 0 Generic Substitution: Start Date: End Date: One Time Use: Substitution Permitted 03/30/2020 No Note to Pharmacy: Hand Signature: Date(s): Electronic Signature(s) Signed: 03/30/2020 5:08:01 PM By: Elliot Gurney, BSN, RN, CWS, Kim RN, BSN Signed: 03/31/2020 12:48:36 PM By: Baltazar Najjar MD Entered By: Elliot Gurney, BSN, RN, CWS, Kim on 03/30/2020 10:55:21 Joseph Joseph Freeman ([id number]) --------------------------------------------------------------------------------  Problem List Details Patient Name: ANTHES, Lawton P. Date of Service: 03/30/2020 9:30 AM Medical Record Number: [id number] Patient Account Number: [id number] Date of Birth/Sex: 1985/06/16 (  35 y.o. M) Treating RN: Huel CoventryWoody, Kim Primary Care Provider: Julieanne MansonMULBERRY, ELIZABETH Other Clinician: Referring Provider: Jorje GuildWATT, ALAN Treating Provider/Extender: Maxwell CaulOBSON, MICHAEL G Weeks in Treatment: 0 Active Problems ICD-10 Encounter Code Description Active Date MDM Diagnosis L02.214 Cutaneous abscess of groin 03/30/2020 No Yes Inactive Problems Resolved Problems Electronic Signature(s) Signed: 03/31/2020 12:48:36 PM By: Baltazar Najjarobson, Michael MD Entered By: Baltazar Najjarobson, Michael on 03/30/2020 10:56:02 Lips, Rhiley P. ([id number]) -------------------------------------------------------------------------------- Progress Note Details Patient Name: Joseph BurlyFREEMAN, Joseph P. Date of Service: 03/30/2020 9:30 AM Medical Record Number: [id number] Patient Account Number: [id number] Date of Birth/Sex: April 11, 1985 (35 y.o. M) Treating RN: Huel CoventryWoody, Kim Primary Care Provider:  Julieanne MansonMULBERRY, ELIZABETH Other Clinician: Referring Provider: Jorje GuildWATT, ALAN Treating Provider/Extender: Altamese [place]OBSON, MICHAEL G Weeks in Treatment: 0 Subjective Chief Complaint Information obtained from Patient 03/30/2020; patient is here for review of an area in his right upper scrotum History of Present Illness (HPI) Admission 03/30/2020 This is a 35 year old man who is currently undergoing detox at [facility]. He is here for an area in his right upper scrotum at the medial part of his inguinal fold. He tells me that sometime around Christmas he developed a very painful swelling in this area. He went to the ER at [facility] where he lives and apparently they did an IandD gave him antibiotics and sent him home. This resolved only several weeks later it recurred. He apparently took himself [facility] although I do not see any information on this in care everywhere. Once again he had developed a painful swelling in this area that they again IandD and put him on doxycycline. He has completed this. He states since he was admitted to [facility] he developed some pain in this area and also some drainage at night. He is here for review of this. Past medical history hypothyroidism hypertension, substance abuse that [facility]. He has not been systemically unwell no fever no chills no scrotal pain Patient History Information obtained from Patient. Allergies penicillin Social History Current some day smoker, Marital Status - Single, Alcohol Use - Daily, Drug Use - Prior History, Caffeine Use - Moderate. Medical History Eyes Denies history of Cataracts, Glaucoma, Optic Neuritis Ear/Nose/Mouth/Throat Denies history of Chronic sinus problems/congestion, Middle ear problems Hematologic/Lymphatic Denies history of Anemia, Hemophilia, Human Immunodeficiency Virus, Lymphedema, Sickle Cell Disease Respiratory Patient has history of Asthma Denies history of Aspiration, Chronic  Obstructive Pulmonary Disease (COPD), Pneumothorax, Sleep Apnea, Tuberculosis Cardiovascular Denies history of Angina, Arrhythmia, Congestive Heart Failure, Coronary Artery Disease, Deep Vein Thrombosis, Hypertension, Hypotension, Myocardial Infarction, Peripheral Arterial Disease, Peripheral Venous Disease, Phlebitis, Vasculitis Gastrointestinal Denies history of Cirrhosis , Colitis, Crohn s, Hepatitis A, Hepatitis B, Hepatitis C Endocrine Denies history of Type I Diabetes, Type II Diabetes Genitourinary Denies history of End Stage Renal Disease Immunological Denies history of Lupus Erythematosus, Raynaud s, Scleroderma Integumentary (Skin) Denies history of History of Burn, History of pressure wounds Musculoskeletal Patient has history of Osteoarthritis Denies history of Gout, Rheumatoid Arthritis, Osteomyelitis Neurologic Denies history of Dementia, Neuropathy, Quadriplegia, Paraplegia, Seizure Disorder Oncologic Denies history of Received Chemotherapy, Received Radiation Psychiatric Denies history of Anorexia/bulimia, Confinement Anxiety Review of Systems (ROS) Eyes Denies complaints or symptoms of Dry Eyes, Vision Changes, Glasses / Contacts. Joseph HuaFREEMAN, Joseph P. ([id number]) Ear/Nose/Mouth/Throat Denies complaints or symptoms of Difficult clearing ears, Sinusitis. Hematologic/Lymphatic Denies complaints or symptoms of Bleeding / Clotting Disorders, Human Immunodeficiency Virus. Respiratory Denies complaints or symptoms of Chronic or frequent coughs, Shortness of Breath. Cardiovascular Denies complaints or symptoms of Chest pain, LE edema.  Gastrointestinal Denies complaints or symptoms of Frequent diarrhea, Nausea, Vomiting. Endocrine Complains or has symptoms of Thyroid disease. Denies complaints or symptoms of Hepatitis, Polydypsia (Excessive Thirst). Genitourinary Denies complaints or symptoms of Kidney failure/ Dialysis, Incontinence/dribbling. Immunological Denies  complaints or symptoms of Hives, Itching. Integumentary (Skin) Complains or has symptoms of Breakdown. Denies complaints or symptoms of Wounds, Bleeding or bruising tendency, Swelling. Musculoskeletal Complains or has symptoms of Muscle Weakness. Denies complaints or symptoms of Muscle Pain. Neurologic Denies complaints or symptoms of Numbness/parasthesias, Focal/Weakness. Psychiatric Complains or has symptoms of Anxiety, Depression Objective Constitutional Patient is hypertensive.. Pulse regular and within target range for patient.Marland Kitchen Respirations regular, non-labored and within target range.. Temperature is normal and within the target range for the patient.Marland Kitchen appears in no distress. Vitals Time Taken: 9:53 AM, Height: 68 in, Source: Measured, Weight: 173 lbs, BMI: 26.3, Temperature: 98.2 F, Pulse: 95 bpm, Respiratory Rate: 20 breaths/min, Blood Pressure: 174/113 mmHg. General Notes: currently detoxing-9 days Gastrointestinal (GI) The patient was very anxious but concerning Nedra Hai he had some tenderness at the inguinal fold or just above it in the lower abdomen no guarding guarding or rebound. Genitourinary (GU) There was no tenderness to palpation in the scrotum testes was normal. Inguinal canal seem normal. Lymphatic No inguinal adenopathy. General Notes: Wound exam; he had a small raised swelling in the medial part of the inguinal fold the upper scrotum just over where the inguinal canal would enter the scrotum. This was exceptionally tender. I used injectable lidocaine to numb this area and then opened it with a #15 scalpel there was a relatively large amount of purulent drainage which I cultured. Hemostasis with direct pressure Integumentary (Hair, Skin) Wound #1 status is Open. Original cause of wound was Gradually Appeared. The date acquired was: 01/26/2020. The wound is located on the Right Groin. The wound measures 1.5cm length x 2.5cm width x 1.2cm depth; 2.945cm^2 area and  3.534cm^3 volume. There is no tunneling or undermining noted. There is a small amount of serous drainage noted. There is no granulation within the wound bed. There is no necrotic tissue within the wound bed. Assessment Active Problems ICD-10 Cutaneous abscess of groin Joseph Freeman, Joseph P. ([id number]) Procedures Wound #1 Pre-procedure diagnosis of Wound #1 is an Abscess located on the Right Groin . Abscess incision and drainage was provided by Maxwell Caul, MD. The skin was cleansed and prepped with anti-septic followed by pain control using Lidocaine Injectable. An incision was made in the right groin with the following instrument(s): Blade. There was an immediate release of Purulent fluid. A Moderate amount of bleeding was controlled with Pressure. A time out was conducted at 10:40, prior to the start of the procedure. Swab culture was sent. The procedure was tolerated well with a pain level of 10 throughout and a pain level of 3 following the procedure. Post procedure Diagnosis Wound #1: Same as Pre-Procedure Plan Follow-up Appointments: Return Appointment in 1 week. Bathing/ Shower/ Hygiene: May shower; gently cleanse wound with antibacterial soap, rinse and pat dry prior to dressing wounds Medications-Please add to medication list.: Wound #1 Right Groin: FreemanO. Antibiotics - Septra DS Topical Antibiotic - Neosporin Laboratory ordered were: Wound culture routine - right groin The following medication(s) was prescribed: sulfamethoxazole-trimethoprim oral 800 mg-160 mg tablet 1 1 tablet oral twice daily starting 03/30/2020 WOUND #1: - Groin Wound Laterality: Right Cleanser: Soap and Water Discharge Instructions: Gently cleanse wound with antibacterial soap, rinse and pat dry prior to dressing wounds Primary Dressing: Gauze Discharge Instructions:  As directed: dry, moistened with saline or moistened with Dakins Solution Secured With: Tegaderm Film 4x4 (in/in) Discharge  Instructions: Apply to wound bed 1. I put him on empiric Septra DS while we await culture 2. Topical Neosporin to the wound with covering gauze 3. The wound had about 1 cm in depth I am hoping this will close and spontaneously 4. The reason for recurrence of abscesses in this area is unclear. He does not have a history of other abscesses. It is possible he is cross contaminating this area. The tenderness above this over the inguinal area was also concerning would wonder whether he has an underlying area that is draining to this wound. He may need an ultrasound. 5. There was no scrotal abnormalities palpable on the right side his inguinal canal was nontender I spent 35 minutes in review of this patient's past medical history, face-to-face evaluation and preparation of this record Electronic Signature(s) Signed: 03/31/2020 12:48:36 PM By: Baltazar Najjar MD Entered By: Baltazar Najjar on 03/30/2020 11:24:16 Joseph Joseph Freeman ([id number]) -------------------------------------------------------------------------------- ROS/PFSH Details Patient Name: Joseph Joseph Freeman, Joseph P. Date of Service: 03/30/2020 9:30 AM Medical Record Number: [id number] Patient Account Number: [id number] Date of Birth/Sex: 11-May-1985 (35 y.o. M) Treating RN: Rogers Blocker Primary Care Provider: Julieanne Manson Other Clinician: Referring Provider: Jorje Guild Treating Provider/Extender: Altamese [place] in Treatment: 0 Information Obtained From Patient Eyes Complaints and Symptoms: Negative for: Dry Eyes; Vision Changes; Glasses / Contacts Medical History: Negative for: Cataracts; Glaucoma; Optic Neuritis Ear/Nose/Mouth/Throat Complaints and Symptoms: Negative for: Difficult clearing ears; Sinusitis Medical History: Negative for: Chronic sinus problems/congestion; Middle ear problems Hematologic/Lymphatic Complaints and Symptoms: Negative for: Bleeding / Clotting Disorders; Human Immunodeficiency  Virus Medical History: Negative for: Anemia; Hemophilia; Human Immunodeficiency Virus; Lymphedema; Sickle Cell Disease Respiratory Complaints and Symptoms: Negative for: Chronic or frequent coughs; Shortness of Breath Medical History: Positive for: Asthma Negative for: Aspiration; Chronic Obstructive Pulmonary Disease (COPD); Pneumothorax; Sleep Apnea; Tuberculosis Cardiovascular Complaints and Symptoms: Negative for: Chest pain; LE edema Medical History: Negative for: Angina; Arrhythmia; Congestive Heart Failure; Coronary Artery Disease; Deep Vein Thrombosis; Hypertension; Hypotension; Myocardial Infarction; Peripheral Arterial Disease; Peripheral Venous Disease; Phlebitis; Vasculitis Gastrointestinal Complaints and Symptoms: Negative for: Frequent diarrhea; Nausea; Vomiting Medical History: Negative for: Cirrhosis ; Colitis; Crohnos; Hepatitis A; Hepatitis B; Hepatitis C Endocrine Complaints and Symptoms: Positive for: Thyroid disease Negative for: Hepatitis; Polydypsia (Excessive Thirst) Medical History: Negative for: Type I Diabetes; Type II Diabetes Langworthy, Joseph P. ([id number]) Genitourinary Complaints and Symptoms: Negative for: Kidney failure/ Dialysis; Incontinence/dribbling Medical History: Negative for: End Stage Renal Disease Immunological Complaints and Symptoms: Negative for: Hives; Itching Medical History: Negative for: Lupus Erythematosus; Raynaudos; Scleroderma Integumentary (Skin) Complaints and Symptoms: Positive for: Breakdown Negative for: Wounds; Bleeding or bruising tendency; Swelling Medical History: Negative for: History of Burn; History of pressure wounds Musculoskeletal Complaints and Symptoms: Positive for: Muscle Weakness Negative for: Muscle Pain Medical History: Positive for: Osteoarthritis Negative for: Gout; Rheumatoid Arthritis; Osteomyelitis Neurologic Complaints and Symptoms: Negative for: Numbness/parasthesias;  Focal/Weakness Medical History: Negative for: Dementia; Neuropathy; Quadriplegia; Paraplegia; Seizure Disorder Psychiatric Complaints and Symptoms: Positive for: Anxiety Review of System Notes: Depression Medical History: Negative for: Anorexia/bulimia; Confinement Anxiety Oncologic Medical History: Negative for: Received Chemotherapy; Received Radiation Immunizations Pneumococcal Vaccine: Received Pneumococcal Vaccination: No Implantable Devices None Family and Social History Current some day smoker; Marital Status - Single; Alcohol Use: Daily; Drug Use: Prior History; Caffeine Use: Moderate IZAIAH, Joseph Freeman. 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 Date of Service: 03/30/2020 Medical Record Number: [id number] Patient Account Number: [id number] Date of Birth/Sex: 1985/06/07 (35 y.o. M) Treating RN: Huel Coventry Primary Care Provider: Julieanne Manson Other Clinician: Referring Provider: Jorje Guild Treating Provider/Extender: Maxwell Caul Weeks in Treatment: 0 Diagnosis Coding ICD-10 Codes Code Description L02.214 Cutaneous abscess of groin Facility Procedures CPT4 Code: [id number] Description: 99213 - WOUND CARE VISIT-LEV 3 EST PT Modifier: Quantity: 1 CPT4 Code: [id number] Description: 10060 - I and D Abscess; simple/single Modifier: Quantity: 1 CPT4 Code: Description: ICD-10 Diagnosis Description L02.214 Cutaneous abscess of groin Modifier: Quantity: Physician Procedures CPT4 Code: [id number] Description: WC PHYS LEVEL 3 o NEW PT Modifier: 25 Quantity: 1 CPT4 Code: Description: ICD-10  Diagnosis Description L02.214 Cutaneous abscess of groin Modifier: Quantity: CPT4 Code: [id number] Description: 10060 - I and D Abscess; simple/single Modifier: Quantity: 1 CPT4 Code: Description: ICD-10 Diagnosis Description L02.214 Cutaneous abscess of groin Modifier: Quantity: Electronic Signature(s) Signed: 03/30/2020 5:05:30 PM By: Elliot Gurney, BSN, RN, CWS, Kim RN, BSN Signed: 03/31/2020 12:48:36 PM By: Baltazar Najjar MD Entered By: Elliot Gurney, BSN, RN, CWS, Kim on 03/30/2020 17:05:30

## 2020-04-02 LAB — AEROBIC CULTURE W GRAM STAIN (SUPERFICIAL SPECIMEN)

## 2020-04-06 ENCOUNTER — Encounter: Payer: Non-veteran care | Attending: Internal Medicine | Admitting: Internal Medicine

## 2020-04-06 ENCOUNTER — Other Ambulatory Visit: Payer: Self-pay

## 2020-04-06 DIAGNOSIS — E039 Hypothyroidism, unspecified: Secondary | ICD-10-CM | POA: Insufficient documentation

## 2020-04-06 DIAGNOSIS — L02214 Cutaneous abscess of groin: Secondary | ICD-10-CM | POA: Insufficient documentation

## 2020-04-06 DIAGNOSIS — I10 Essential (primary) hypertension: Secondary | ICD-10-CM | POA: Insufficient documentation

## 2020-04-07 NOTE — Progress Notes (Addendum)
DENCIL, CAYSON ([id number]) Visit Report for 04/06/2020 Arrival Information Details Patient Name: Joseph Freeman, Joseph Freeman. Date of Service: 04/06/2020 1:30 PM Medical Record Number: [id number] Patient Account Number: [id number] Date of Birth/Sex: 1985-10-12 (35 y.o. M) Treating RN: Rogers Blocker Primary Care Gaven Eugene: Julieanne Manson Other Clinician: Lolita Cram Referring Chauntae Hults: Julieanne Manson Treating Jaloni Davoli/Extender: Altamese [place] in Treatment: 1 Visit Information History Since Last Visit Pain Present Now: No Patient Arrived: Ambulatory Arrival Time: 13:39 Accompanied By: self Transfer Assistance: None Patient Identification Verified: Yes Secondary Verification Process Completed: Yes Electronic Signature(s) Signed: 04/06/2020 5:21:44 PM By: Phillis Haggis, Dondra Prader RN Entered By: Phillis Haggis, Dondra Prader on 04/06/2020 13:40:07 Joseph Freeman ([id number]) -------------------------------------------------------------------------------- Clinic Level of Care Assessment Details Patient Name: Joseph Freeman. Date of Service: 04/06/2020 1:30 PM Medical Record Number: [id number] Patient Account Number: [id number] Date of Birth/Sex: October 31, 1985 (35 y.o. M) Treating RN: Huel Coventry Primary Care Ordell Prichett: Julieanne Manson Other Clinician: Lolita Cram Referring Olyvia Gopal: Julieanne Manson Treating Ercia Crisafulli/Extender: Altamese [place] in Treatment: 1 Clinic Level of Care Assessment Items TOOL 4 Quantity Score []  - Use when only an EandM is performed on FOLLOW-UP visit 0 ASSESSMENTS - Nursing Assessment / Reassessment []  - Reassessment of Co-morbidities (includes updates in patient status) 0 X- 1 5 Reassessment of Adherence to Treatment Plan ASSESSMENTS - Wound and Skin Assessment / Reassessment X - Simple Wound Assessment / Reassessment - one wound 1 5 []  - 0 Complex Wound Assessment / Reassessment - multiple wounds []  - 0 Dermatologic / Skin  Assessment (not related to wound area) ASSESSMENTS - Focused Assessment []  - Circumferential Edema Measurements - multi extremities 0 []  - 0 Nutritional Assessment / Counseling / Intervention []  - 0 Lower Extremity Assessment (monofilament, tuning fork, pulses) []  - 0 Peripheral Arterial Disease Assessment (using hand held doppler) ASSESSMENTS - Ostomy and/or Continence Assessment and Care []  - Incontinence Assessment and Management 0 []  - 0 Ostomy Care Assessment and Management (repouching, etc.) PROCESS - Coordination of Care X - Simple Patient / Family Education for ongoing care 1 15 []  - 0 Complex (extensive) Patient / Family Education for ongoing care []  - 0 Staff obtains , Records, Test Results / Process Orders []  - 0 Staff telephones HHA, Nursing Homes / Clarify orders / etc []  - 0 Routine Transfer to another Facility (non-emergent condition) []  - 0 Routine Hospital Admission (non-emergent condition) []  - 0 New Admissions / / Ordering NPWT, Apligraf, etc. []  - 0 Emergency Hospital Admission (emergent condition) X- 1 10 Simple Discharge Coordination []  - 0 Complex (extensive) Discharge Coordination PROCESS - Special Needs []  - Pediatric / Minor Patient Management 0 []  - 0 Isolation Patient Management []  - 0 Hearing / Language / Visual special needs []  - 0 Assessment of Community assistance (transportation, D/C planning, etc.) []  - 0 Additional assistance / Altered mentation []  - 0 Support Surface(s) Assessment (bed, cushion, seat, etc.) INTERVENTIONS - Wound Cleansing / Measurement Utz, Lennox P. ( ) X- 1 5 Simple Wound Cleansing - one wound []  - 0 Complex Wound Cleansing - multiple wounds X- 1 5 Wound Imaging (photographs - any number of wounds) []  - 0 Wound Tracing (instead of photographs) X- 1 5 Simple Wound Measurement - one wound []  - 0 Complex Wound Measurement - multiple wounds INTERVENTIONS - Wound  Dressings []  - Small Wound Dressing one or multiple wounds 0 []  - 0 Medium Wound Dressing one or multiple wounds []  - 0 Large Wound Dressing one or multiple wounds []  -  0 Application of Medications - topical []  - 0 Application of Medications - injection INTERVENTIONS - Miscellaneous []  - External ear exam 0 []  - 0 Specimen Collection (cultures, biopsies, blood, body fluids, etc.) []  - 0 Specimen(s) / Culture(s) sent or taken to Lab for analysis []  - 0 Patient Transfer (multiple staff / / Similar devices) []  - 0 Simple Staple / Suture removal (25 or less) []  - 0 Complex Staple / Suture removal (26 or more) []  - 0 Hypo / Hyperglycemic Management (close monitor of Blood Glucose) []  - 0 Ankle / Brachial Index (ABI) - do not check if billed separately X- 1 5 Vital Signs Has the patient been seen at the hospital within the last three years: Yes Total Score: 55 Level Of Care: New/Established - Level 2 Electronic Signature(s) Unsigned Entered By: , BSN, RN, CWS, Kim on 04/06/2020 18:42:49 Signature(s): Date(s): ( ) -------------------------------------------------------------------------------- Encounter Discharge Information Details Patient Name: Nurse, adult, Traivon P. Date of Service: 04/06/2020 1:30 PM Medical Record Number: Patient Account Number: Date of Birth/Sex: 12-06-1985 (34 y.o. M) Treating RN: 06/06/2020 Primary Care Sydney Hasten: Joseph Freeman Other Clinician: [id number] Referring Lynzee Lindquist: Neale Burly Treating Seon Gaertner/Extender: 06/06/2020 in Treatment: 1 Encounter Discharge Information Items Discharge Condition: Stable Ambulatory Status: Ambulatory Discharge Destination: Home Transportation: Private Auto Accompanied By: self Schedule Follow-up Appointment: No Clinical Summary of Care: Electronic Signature(s) Signed: 04/06/2020 6:43:32 PM By: [id number], BSN, RN, CWS, Kim  RN, BSN Entered By: 09/21/1985, BSN, RN, CWS, Kim on 04/06/2020 18:43:30 Huel Coventry (Julieanne Manson) -------------------------------------------------------------------------------- Lower Extremity Assessment Details Patient Name: GEIMER, Christien P. Date of Service: 04/06/2020 1:30 PM Medical Record Number: Altamese [place] Patient Account Number: 06/06/2020 Date of Birth/Sex: 10-Jan-1986 (35 y.o. M) Treating RN: 06/06/2020 Primary Care Byrant Valent: Joseph Freeman Other Clinician: [id number] Referring Jatavian Calica: Neale Burly Treating Anyae Griffith/Extender: 06/06/2020 in Treatment: 1 Electronic Signature(s) Signed: 04/06/2020 5:21:44 PM By: [id number], 09/21/1985 RN Entered By: 31, Rogers Blocker on 04/06/2020 13:45:17 Lolita Cram (Julieanne Manson) -------------------------------------------------------------------------------- Multi Wound Chart Details Patient Name: Altamese [place], Shaquelle P. Date of Service: 04/06/2020 1:30 PM Medical Record Number: Phillis Haggis Patient Account Number: Dondra Prader Date of Birth/Sex: July 27, 1985 (35 y.o. M) Treating RN: 06/06/2020 Primary Care Patti Shorb: Joseph Freeman Other Clinician: [id number] Referring Eymi Lipuma: Neale Burly Treating Daronte Shostak/Extender: 06/06/2020 in Treatment: 1 Vital Signs Height(in): 68 Pulse(bpm): 72 Weight(lbs): 173 Blood Pressure(mmHg): 152/78 Body Mass Index(BMI): 26 Temperature(F): 98.2 Respiratory Rate(breaths/min): 18 Photos: [N/A:N/A] Wound Location: Right Groin N/A N/A Wounding Event: Gradually Appeared N/A N/A Primary Etiology: Abscess N/A N/A Comorbid History: Asthma, Osteoarthritis N/A N/A Date Acquired: 01/26/2020 N/A N/A Weeks of Treatment: 1 N/A N/A Wound Status: Healed - Epithelialized N/A N/A Measurements L x W x D (cm) 0x0x0 N/A N/A Area (cm) : 0 N/A N/A Volume (cm) : 0 N/A N/A % Reduction in Area: 100.00% N/A N/A % Reduction in Volume: 100.00% N/A  N/A Classification: Partial Thickness N/A N/A Exudate Amount: None Present N/A N/A Granulation Amount: None Present (0%) N/A N/A Necrotic Amount: None Present (0%) N/A N/A Exposed Structures: Fascia: No N/A N/A Fat Layer (Subcutaneous Tissue): No Tendon: No Muscle: No Joint: No Bone: No Epithelialization: Large (67-100%) N/A N/A Treatment Notes Electronic Signature(s) Signed: 04/06/2020 6:42:11 PM By: 09/21/1985, BSN, RN, CWS, Kim RN, BSN Previous Signature: 04/06/2020 5:07:57 PM Version By: Huel Coventry MD Entered By: Julieanne Manson, BSN, RN, CWS, Kim on 04/06/2020 18:42:09 Julieanne Manson (Altamese [place]) -------------------------------------------------------------------------------- Multi-Disciplinary Care Plan  Details Patient Name: QUANTA, ROHER. Date of Service: 04/06/2020 1:30 PM Medical Record Number: [id number] Patient Account Number: [id number] Date of Birth/Sex: 10/26/1985 (35 y.o. M) Treating RN: Huel Coventry Primary Care Benney [place]: Julieanne Manson Other Clinician: Lolita Cram Referring Rand Boller: Julieanne Manson Treating Leavy Heatherly/Extender: Altamese [place] in Treatment: 1 Active Inactive Electronic Signature(s) Signed: 04/06/2020 6:41:57 PM By: Elliot Gurney, BSN, RN, CWS, Kim RN, BSN Entered By: Elliot Gurney, BSN, RN, CWS, Kim on 04/06/2020 18:41:56 Joseph Freeman ([id number]) -------------------------------------------------------------------------------- Pain Assessment Details Patient Name: Neale Burly, Phoenix P. Date of Service: 04/06/2020 1:30 PM Medical Record Number: [id number] Patient Account Number: [id number] Date of Birth/Sex: 28-Dec-1985 (35 y.o. M) Treating RN: Rogers Blocker Primary Care Quame Spratlin: Julieanne Manson Other Clinician: Lolita Cram Referring Massai Hankerson: Julieanne Manson Treating Takiera Mayo/Extender: Altamese [place] in Treatment: 1 Active Problems Location of Pain Severity and Description of Pain Patient Has Paino No Site  Locations Rate the pain. Current Pain Level: 0 Pain Management and Medication Current Pain Management: Electronic Signature(s) Signed: 04/06/2020 5:21:44 PM By: Phillis Haggis, Dondra Prader RN Entered By: Phillis Haggis, Kenia on 04/06/2020 13:42:17 Joseph Freeman ([id number]) -------------------------------------------------------------------------------- Wound Assessment Details Patient Name: Neale Burly, Hatem P. Date of Service: 04/06/2020 1:30 PM Medical Record Number: [id number] Patient Account Number: [id number] Date of Birth/Sex: [birth year] (35 y.o. M) Treating RN: Huel Coventry Primary Care Jacobie Stamey: Julieanne Manson Other Clinician: Lolita Cram Referring Tayler Heiden: Julieanne Manson Treating Vida Nicol/Extender: Altamese [place] in Treatment: 1 Wound Status Wound Number: 1 Primary Etiology: Abscess Wound Location: Right Groin Wound Status: Healed - Epithelialized Wounding Event: Gradually Appeared Comorbid History: Asthma, Osteoarthritis Date Acquired: 01/26/2020 Weeks Of Treatment: 1 Clustered Wound: No Photos Wound Measurements Length: (cm) 0 Width: (cm) 0 Depth: (cm) 0 Area: (cm) 0 Volume: (cm) 0 % Reduction in Area: 100% % Reduction in Volume: 100% Epithelialization: Large (67-100%) Tunneling: No Undermining: No Wound Description Classification: Partial Thickness Exudate Amount: None Present Foul Odor After Cleansing: No Slough/Fibrino No Wound Bed Granulation Amount: None Present (0%) Exposed Structure Necrotic Amount: None Present (0%) Fascia Exposed: No Fat Layer (Subcutaneous Tissue) Exposed: No Tendon Exposed: No Muscle Exposed: No Joint Exposed: No Bone Exposed: No Electronic Signature(s) Signed: 04/06/2020 5:56:34 PM By: Elliot Gurney, BSN, RN, CWS, Kim RN, BSN Entered By: Elliot Gurney, BSN, RN, CWS, Kim on 04/06/2020 13:58:49 Collyer, Consuello Closs ([id number]) -------------------------------------------------------------------------------- Vitals  Details Patient Name: Neale Burly, Diontre P. Date of Service: 04/06/2020 1:30 PM Medical Record Number: [id number] Patient Account Number: [id number] Date of Birth/Sex: 1985-08-06 (35 y.o. M) Treating RN: Rogers Blocker Primary Care Eden Rho: Julieanne Manson Other Clinician: Lolita Cram Referring Jeffree Cazeau: Julieanne Manson Treating Patrisha Hausmann/Extender: Altamese [place] in Treatment: 1 Vital Signs Time Taken: 13:40 Temperature (F): 98.2 Height (in): 68 Pulse (bpm): 72 Weight (lbs): 173 Respiratory Rate (breaths/min): 18 Body Mass Index (BMI): 26.3 Blood Pressure (mmHg): 152/78 Reference Range: 80 - 120 mg / dl Electronic Signature(s) Signed: 04/06/2020 5:21:44 PM By: Phillis Haggis, Dondra Prader RN Entered By: Phillis Haggis, Dondra Prader on 04/06/2020 13:42:03

## 2020-04-07 NOTE — Progress Notes (Signed)
JANZIEL, HOCKETT ([id number]) Visit Report for 04/06/2020 HPI Details Patient Name: Joseph Freeman, Joseph Freeman. Date of Service: 04/06/2020 1:30 PM Medical Record Number: [id number] Patient Account Number: [id number] Date of Birth/Sex: 03-20-1985 (35 y.o. M) Treating RN: Huel Coventry Primary Care Provider: Julieanne Manson Other Clinician: Lolita Cram Referring Provider: Julieanne Manson Treating Provider/Extender: Altamese [place] in Treatment: 1 History of Present Illness HPI Description: Admission 03/30/2020 This is a 35 year old man who is currently undergoing detox at [facility]. He is here for an area in his right upper scrotum at the medial part of his inguinal fold. He tells me that sometime around Christmas he developed a very painful swelling in this area. He went to the ER at [place] where he lives and apparently they did an IandD gave him antibiotics and sent him home. This resolved only several weeks later it recurred. He apparently took himself to urology at [facility] although I do not see any information on this in care everywhere. Once again he had developed a painful swelling in this area that they again IandD and put him on doxycycline. He has completed this. He states since he was admitted to [facility] he developed some pain in this area and also some drainage at night. He is here for review of this. Past medical history hypothyroidism hypertension, substance abuse that [facility]. He has not been systemically unwell no fever no chills no scrotal pain 3/2 this is a patient we saw last week he had a recurrent scrotal abscess. I did an IandD on this and put him on Septra DS there was a moderate amount of purulent drainage this did not culture however he was on antibiotics shortly before this. Electronic Signature(s) Signed: 04/06/2020 5:07:57 PM By: Baltazar Najjar MD Entered By: Baltazar Najjar on 04/06/2020 14:34:26 [name]  ([id number]) -------------------------------------------------------------------------------- Physical Exam Details Patient Name: Joseph Freeman, Joseph P. Date of Service: 04/06/2020 1:30 PM Medical Record Number: [id number] Patient Account Number: [id number] Date of Birth/Sex: 04/17/1985 (35 y.o. M) Treating RN: Huel Coventry Primary Care Provider: Julieanne Manson Other Clinician: Lolita Cram Referring Provider: Julieanne Manson Treating Provider/Extender: Altamese [place] in Treatment: 1 Constitutional Patient is hypertensive.. Pulse regular and within target range for patient.Marland Kitchen Respirations regular, non-labored and within target range.. Temperature is normal and within the target range for the patient.Marland Kitchen appears in no distress. Notes Wound exam; inguinal fold on the right just above the opening to the site of the inguinal canal. This was completely nontender. Last week IandD a moderate amount of purulent drainage Electronic Signature(s) Signed: 04/06/2020 5:07:57 PM By: Baltazar Najjar MD Entered By: Baltazar Najjar on 04/06/2020 14:35:21 [name] ([id number]) -------------------------------------------------------------------------------- Physician Orders Details Patient Name: Joseph Freeman, Joseph P. Date of Service: 04/06/2020 1:30 PM Medical Record Number: [id number] Patient Account Number: [id number] Date of Birth/Sex: 07/23/1985 (35 y.o. M) Treating RN: Huel Coventry Primary Care Provider: Julieanne Manson Other Clinician: Lolita Cram Referring Provider: Julieanne Manson Treating Provider/Extender: Altamese [place] in Treatment: 1 Verbal / Phone Orders: No Diagnosis Coding Discharge From [facility] Services o Discharge from Wound Care Center Treatment Complete Electronic Signature(s) Signed: 04/06/2020 5:07:57 PM By: Baltazar Najjar MD Signed: 04/06/2020 5:56:34 PM By: Elliot Gurney, BSN, RN, CWS, Kim RN, BSN Entered By: Elliot Gurney, BSN, RN, CWS, Kim on 04/06/2020  13:59:32 [name] ([id number]) -------------------------------------------------------------------------------- Problem List Details Patient Name: Joseph Freeman, Joseph Freeman. Date of Service: 04/06/2020 1:30 PM Medical Record Number: [id number] Patient Account Number: [id number] Date of Birth/Sex: 09-12-1985 (35 y.o.  M) Treating RN: Huel Coventry Primary Care Provider: Julieanne Manson Other Clinician: Lolita Cram Referring Provider: Julieanne Manson Treating Provider/Extender: Altamese [place] in Treatment: 1 Active Problems ICD-10 Encounter Code Description Active Date MDM Diagnosis L02.214 Cutaneous abscess of groin 03/30/2020 No Yes Inactive Problems Resolved Problems Electronic Signature(s) Signed: 04/06/2020 5:07:57 PM By: Baltazar Najjar MD Entered By: Baltazar Najjar on 04/06/2020 14:33:01 Offield, Anay P. ([id number]) -------------------------------------------------------------------------------- Progress Note Details Patient Name: Joseph Freeman, Joseph P. Date of Service: 04/06/2020 1:30 PM Medical Record Number: [id number] Patient Account Number: [id number] Date of Birth/Sex: [birth year] (35 y.o. M) Treating RN: Huel Coventry Primary Care Provider: Julieanne Manson Other Clinician: Lolita Cram Referring Provider: Julieanne Manson Treating Provider/Extender: Altamese [place] in Treatment: 1 Subjective History of Present Illness (HPI) Admission 03/30/2020 This is a 35 year old man who is currently undergoing detox at [facility]. He is here for an area in his right upper scrotum at the medial part of his inguinal fold. He tells me that sometime around Christmas he developed a very painful swelling in this area. He went to the ER at [place] where he lives and apparently they did an IandD gave him antibiotics and sent him home. This resolved only several weeks later it recurred. He apparently took himself to urology at [facility]  although I do not see any information on this in care everywhere. Once again he had developed a painful swelling in this area that they again IandD and put him on doxycycline. He has completed this. He states since he was admitted to [facility] he developed some pain in this area and also some drainage at night. He is here for review of this. Past medical history hypothyroidism hypertension, substance abuse that [facility]. He has not been systemically unwell no fever no chills no scrotal pain 3/2 this is a patient we saw last week he had a recurrent scrotal abscess. I did an IandD on this and put him on Septra DS there was a moderate amount of purulent drainage this did not culture however he was on antibiotics shortly before this. Objective Constitutional Patient is hypertensive.. Pulse regular and within target range for patient.Marland Kitchen Respirations regular, non-labored and within target range.. Temperature is normal and within the target range for the patient.Marland Kitchen appears in no distress. Vitals Time Taken: 1:40 PM, Height: 68 in, Weight: 173 lbs, BMI: 26.3, Temperature: 98.2 F, Pulse: 72 bpm, Respiratory Rate: 18 breaths/min, Blood Pressure: 152/78 mmHg. General Notes: Wound exam; inguinal fold on the right just above the opening to the site of the inguinal canal. This was completely nontender. Last week IandD a moderate amount of purulent drainage Integumentary (Hair, Skin) Wound #1 status is Healed - Epithelialized. Original cause of wound was Gradually Appeared. The date acquired was: 01/26/2020. The wound has been in treatment 1 weeks. The wound is located on the Right Groin. The wound measures 0cm length x 0cm width x 0cm depth; 0cm^2 area and 0cm^3 volume. There is no tunneling or undermining noted. There is a none present amount of drainage noted. There is no granulation within the wound bed. There is no necrotic tissue within the wound bed. Assessment Active  Problems ICD-10 Cutaneous abscess of groin Plan Joseph Freeman, Joseph Freeman ([id number]) Discharge From [facility] Services: Discharge from Wound Care Center Treatment Complete 1. The patient can be discharged from the wound care clinic 2. I advised the patient to seek prompt medical attention if this reoccurs again. This is I think the third goal round of  this he might need the area ultrasounded other than doing Chemical engineer) Signed: 04/06/2020 5:07:57 PM By: Baltazar Najjar MD Entered By: Baltazar Najjar on 04/06/2020 14:36:04 Joseph Freeman, Joseph Freeman ([id number]) -------------------------------------------------------------------------------- SuperBill Details Patient Name: Joseph Freeman, Joseph P. Date of Service: 04/06/2020 Medical Record Number: [id number] Patient Account Number: [id number] Date of Birth/Sex: Jan 23, 1986 (35 y.o. M) Treating RN: Huel Coventry Primary Care Provider: Julieanne Manson Other Clinician: Lolita Cram Referring Provider: Julieanne Manson Treating Provider/Extender: Altamese  in Treatment: 1 Diagnosis Coding ICD-10 Codes Code Description L02.214 Cutaneous abscess of groin Facility Procedures CPT4 Code: [id number] Description: [phone] - WOUND CARE VISIT-LEV 2 EST PT Modifier: Quantity: 1 Physician Procedures CPT4 Code: [id number] Description: [phone] - WC PHYS LEVEL 2 - EST PT Modifier: Quantity: 1 CPT4 Code: Description: ICD-10 Diagnosis Description L02.214 Cutaneous abscess of groin Modifier: Quantity: Electronic Signature(s) Signed: 04/06/2020 6:43:01 PM By: Elliot Gurney, BSN, RN, CWS, Kim RN, BSN Previous Signature: 04/06/2020 5:07:57 PM Version By: Baltazar Najjar MD Entered By: Elliot Gurney, BSN, RN, CWS, Kim on 04/06/2020 18:43:00

## 2020-12-19 ENCOUNTER — Emergency Department (HOSPITAL_BASED_OUTPATIENT_CLINIC_OR_DEPARTMENT_OTHER)
Admission: EM | Admit: 2020-12-19 | Discharge: 2020-12-19 | Disposition: A | Discharge status: Home / Self Care | Payer: No Typology Code available for payment source | Attending: Emergency Medicine | Admitting: Emergency Medicine

## 2020-12-19 ENCOUNTER — Encounter (HOSPITAL_BASED_OUTPATIENT_CLINIC_OR_DEPARTMENT_OTHER): Payer: Self-pay | Admitting: Emergency Medicine

## 2020-12-19 ENCOUNTER — Other Ambulatory Visit: Payer: Self-pay

## 2020-12-19 ENCOUNTER — Emergency Department (HOSPITAL_BASED_OUTPATIENT_CLINIC_OR_DEPARTMENT_OTHER): Payer: No Typology Code available for payment source

## 2020-12-19 DIAGNOSIS — S99922A Unspecified injury of left foot, initial encounter: Secondary | ICD-10-CM | POA: Diagnosis present

## 2020-12-19 DIAGNOSIS — Y92410 Unspecified street and highway as the place of occurrence of the external cause: Secondary | ICD-10-CM | POA: Diagnosis not present

## 2020-12-19 DIAGNOSIS — F17210 Nicotine dependence, cigarettes, uncomplicated: Secondary | ICD-10-CM | POA: Insufficient documentation

## 2020-12-19 DIAGNOSIS — M79672 Pain in left foot: Secondary | ICD-10-CM

## 2020-12-19 DIAGNOSIS — S9032XA Contusion of left foot, initial encounter: Secondary | ICD-10-CM | POA: Insufficient documentation
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
## 2021-12-13 ENCOUNTER — Emergency Department: Admit: 2021-12-13 | Payer: PRIVATE HEALTH INSURANCE

## 2021-12-13 ENCOUNTER — Emergency Department: Payer: PRIVATE HEALTH INSURANCE

## 2021-12-13 ENCOUNTER — Inpatient Hospital Stay
Admit: 2021-12-13 | Discharge: 2021-12-13 | Disposition: A | Discharge status: Home / Self Care | Payer: PRIVATE HEALTH INSURANCE | Attending: Emergency Medicine

## 2021-12-13 DIAGNOSIS — S0232XA Fracture of orbital floor, left side, initial encounter for closed fracture: Principal | ICD-10-CM

## 2021-12-13 LAB — EKG 12-LEAD
Atrial Rate: 79 {beats}/min
P Axis: 32 degrees
P-R Interval: 156 ms
Q-T Interval: 380 ms
QRS Duration: 90 ms
QTc Calculation (Bazett): 435 ms
R Axis: 37 degrees
T Axis: 45 degrees
Ventricular Rate: 79 {beats}/min

## 2021-12-13 LAB — BASIC METABOLIC PANEL
Anion Gap: 17 — ABNORMAL HIGH (ref 3–16)
BUN: 10 mg/dL (ref 7–20)
CO2: 21 mmol/L (ref 21–32)
Calcium: 8.5 mg/dL (ref 8.3–10.6)
Chloride: 104 mmol/L (ref 99–110)
Creatinine: 0.8 mg/dL — ABNORMAL LOW (ref 0.9–1.3)
Est, Glom Filt Rate: >60 (ref >60)
Glucose: 105 mg/dL — ABNORMAL HIGH (ref 70–99)
Potassium: 4.5 mmol/L (ref 3.5–5.1)
Sodium: 142 mmol/L (ref 136–145)

## 2021-12-13 MED ORDER — MORPHINE SULFATE 4 MG/ML IJ SOLN
4 MG/ML | Freq: Once | INTRAMUSCULAR | Status: AC
Start: 2021-12-13 — End: 2021-12-13
  Administered 2021-12-13: 14:00:00 4 mg via INTRAVENOUS

## 2021-12-13 MED ORDER — SODIUM CHLORIDE 0.9 % IV BOLUS
0.9 % | Freq: Once | INTRAVENOUS | Status: AC
Start: 2021-12-13 — End: 2021-12-13
  Administered 2021-12-13: 12:00:00 500 mL via INTRAVENOUS

## 2021-12-13 MED ORDER — IOPAMIDOL 76 % IV SOLN
76 % | Freq: Once | INTRAVENOUS | Status: AC | PRN
Start: 2021-12-13 — End: 2021-12-13
  Administered 2021-12-13: 13:00:00 75 mL via INTRAVENOUS

## 2021-12-13 MED ORDER — ACETAMINOPHEN 500 MG PO TABS
500 MG | Freq: Once | ORAL | Status: AC
Start: 2021-12-13 — End: 2021-12-13
  Administered 2021-12-13: 12:00:00 1000 mg via ORAL

## 2021-12-13 MED ORDER — OXYCODONE-ACETAMINOPHEN 5-325 MG PO TABS
5-325 MG | ORAL_TABLET | Freq: Four times a day (QID) | ORAL | 0 refills | Status: AC | PRN
Start: 2021-12-13 — End: 2021-12-16

## 2021-12-13 MED ORDER — KETOROLAC TROMETHAMINE 15 MG/ML IJ SOLN
15 MG/ML | Freq: Once | INTRAMUSCULAR | Status: AC
Start: 2021-12-13 — End: 2021-12-13
  Administered 2021-12-13: 12:00:00 15 mg via INTRAVENOUS

## 2021-12-13 MED FILL — ACETAMINOPHEN EXTRA STRENGTH 500 MG PO TABS: 500 MG | ORAL | Qty: 2

## 2021-12-13 MED FILL — MORPHINE SULFATE 4 MG/ML IJ SOLN: 4 mg/mL | INTRAMUSCULAR | Qty: 1
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
## 2021-12-13 NOTE — Discharge Instructions (Signed)
Go to [facility] [place]: [place] [address]    You will see the Oral and Maxillofacial surgery team there to talk about you orbital wall fracture today with an appointment at noon today    Take pain medication as prescribed.
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
## 2021-12-13 NOTE — ED Notes (Signed)
Pt discharged to home, given a LYFT, pt once again declined a LYFT to his appt at [facility]  I once again stressed the importance of going to the appt  Ice pack refilled, pt verbalized understanding of discharge instructions.  Pt taken out front to to wait for his [place], Jawanza Zambito D, RN  12/13/21 1122
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
